# Patient Record
Sex: Female | Born: 2000 | Hispanic: Yes | Marital: Single | Smoking: Never smoker
Health system: Southern US, Community
[De-identification: ages and names within clinical notes are randomized; demographics above are authoritative.]

## PROBLEM LIST (undated history)

## (undated) DIAGNOSIS — Z789 Other specified health status: Secondary | ICD-10-CM

## (undated) HISTORY — PX: NO PAST SURGERIES: SHX2092

## (undated) HISTORY — DX: Other specified health status: Z78.9

---
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
## 2020-06-04 ENCOUNTER — Emergency Department: Payer: Medicaid Other

## 2020-06-04 ENCOUNTER — Emergency Department
Admission: EM | Admit: 2020-06-04 | Discharge: 2020-06-04 | Disposition: A | Discharge status: Home / Self Care | Payer: Medicaid Other | Attending: Emergency Medicine | Admitting: Emergency Medicine

## 2020-06-04 ENCOUNTER — Other Ambulatory Visit: Payer: Self-pay

## 2020-06-04 DIAGNOSIS — N939 Abnormal uterine and vaginal bleeding, unspecified: Secondary | ICD-10-CM | POA: Insufficient documentation

## 2020-06-04 DIAGNOSIS — E669 Obesity, unspecified: Secondary | ICD-10-CM | POA: Diagnosis not present

## 2020-06-04 LAB — POC URINE PREG, ED: Preg Test, Ur: NEGATIVE

## 2020-06-04 MED ORDER — NORGESTIMATE-ETH ESTRADIOL 0.25-35 MG-MCG PO TABS: 1.0000 | ORAL_TABLET | Freq: Every day | ORAL | 0 refills | Status: DC

## 2020-06-04 NOTE — ED Triage Notes (Signed)
C/O vaginal bleeding x 2 weeks.  Also passing clots at times.  AAOx3.  Skin warm and dry. NAD

## 2020-06-04 NOTE — ED Provider Notes (Signed)
[facility] Emergency Department Provider Note  ____________________________________________   Event Date/Time   First MD Initiated Contact with Patient 06/04/20 1638     (approximate)  I have reviewed the triage vital signs and the nursing notes.   HISTORY  Chief Complaint Vaginal Bleeding   HPI Michelle Hahn is a 20 y.o. female with a past medical history of irregular periods who presents for assessment of some vaginal spotting and bleeding that is abnormal for her.  Patient states that her last normal period was in the middle of April she started bleeding again towards end of April and had been bleeding almost daily since then initially with large clots but now just intermittently spotting.  She is not on any birth control.  She states she sometimes has some pelvic cramps but no other back pain, upper abdominal pain, abdominal discharge, burning with urination, chest pain, cough, shortness of breath, back pain, headache or earache, sore throat, fevers, chills, rash or any other acute sick symptoms.  Patient states she is not sure if she could have had a miscarriage and never took a pregnancy test but was not sure because she was passing clots.  No other acute concerns at this time.         History reviewed. No pertinent past medical history.  There are no problems to display for this patient.   History reviewed. No pertinent surgical history.  Prior to Admission medications   Medication Sig Start Date End Date Taking? Authorizing Provider  norgestimate-ethinyl estradiol (SPRINTEC 28) 0.25-35 MG-MCG tablet Take 1 tablet by mouth daily. 06/04/20 07/04/20 Yes Gilles Chiquito, MD  Ferrous Sulfate (IRON) 325 (65 Fe) MG TABS Take 1 tablet by mouth daily. 09/09/15   Nita Sickle, MD    Allergies Patient has no known allergies.  No family history on file.  Social History Social History   Tobacco Use  . Smoking status: Never Smoker  .  Smokeless tobacco: Never Used  Substance Use Topics  . Alcohol use: No    Review of Systems  Review of Systems  Constitutional: Negative for chills and fever.  HENT: Negative for sore throat.   Eyes: Negative for pain.  Respiratory: Negative for cough and stridor.   Cardiovascular: Negative for chest pain.  Gastrointestinal: Positive for abdominal pain. Negative for vomiting.  Genitourinary: Negative for dysuria.  Musculoskeletal: Negative for myalgias.  Skin: Negative for rash.  Neurological: Negative for seizures, loss of consciousness and headaches.  Psychiatric/Behavioral: Negative for suicidal ideas.  All other systems reviewed and are negative.     ____________________________________________   PHYSICAL EXAM:  VITAL SIGNS: ED Triage Vitals  Enc Vitals Group     BP 06/04/20 1616 125/72     Pulse Rate 06/04/20 1616 88     Resp 06/04/20 1616 19     Temp 06/04/20 1616 98.4 F (36.9 C)     Temp src --      SpO2 06/04/20 1616 100 %     Weight --      Height --      Head Circumference --      Peak Flow --      Pain Score 06/04/20 1613 0     Pain Loc --      Pain Edu? --      Excl. in GC? --    Vitals:   06/04/20 1616  BP: 125/72  Pulse: 88  Resp: 19  Temp: 98.4 F (36.9 C)  SpO2:  100%   Physical Exam Vitals and nursing note reviewed.  Constitutional:      General: She is not in acute distress.    Appearance: She is well-developed. She is obese.  HENT:     Head: Normocephalic and atraumatic.     Right Ear: External ear normal.     Left Ear: External ear normal.     Nose: Nose normal.     Mouth/Throat:     Mouth: Mucous membranes are moist.  Eyes:     Conjunctiva/sclera: Conjunctivae normal.  Cardiovascular:     Rate and Rhythm: Normal rate and regular rhythm.     Heart sounds: No murmur heard.   Pulmonary:     Effort: Pulmonary effort is normal. No respiratory distress.  Abdominal:     Palpations: Abdomen is soft.     Tenderness: There is  no abdominal tenderness. There is no right CVA tenderness or left CVA tenderness.  Musculoskeletal:     Cervical back: Neck supple.  Skin:    General: Skin is warm and dry.     Capillary Refill: Capillary refill takes less than 2 seconds.  Neurological:     Mental Status: She is alert and oriented to person, place, and time.  Psychiatric:        Mood and Affect: Mood normal.   No CVA tenderness.  Patient's abdomen is soft nontender throughout.  ____________________________________________   LABS (all labs ordered are listed, but only abnormal results are displayed)  Labs Reviewed  POC URINE PREG, ED   ____________________________________________  EKG ____________________________________________  RADIOLOGY  ED MD interpretation: Some thickening of the endometrial stripe without any focal abnormalities.  Otherwise unremarkable ultrasound and normal-appearing pelvis.  Official radiology report(s): US PELVIC COMPLETE WITH TRANSVAGINAL  Result Date: 06/04/2020 CLINICAL DATA:  Initial evaluation for painful vaginal bleeding for 2 weeks. EXAM: TRANSABDOMINAL AND TRANSVAGINAL ULTRASOUND OF PELVIS TECHNIQUE: Both transabdominal and transvaginal ultrasound examinations of the pelvis were performed. Transabdominal technique was performed for global imaging of the pelvis including uterus, ovaries, adnexal regions, and pelvic cul-de-sac. It was necessary to proceed with endovaginal exam following the transabdominal exam to visualize the uterus, endometrium, and ovaries. COMPARISON:  Prior CT from 02/20/2019. FINDINGS: Uterus Measurements: 6.5 x 3.0 x 4.0 cm = volume: 41.4 mL. Uterus is anteverted. No discrete fibroid or other mass. Endometrium Thickness: 13 mm.  No focal abnormality visualized. Right ovary Measurements: 2.6 x 2.2 x 2.0 cm = volume: 6.1 mL. Normal appearance/no adnexal mass. Left ovary Measurements: 2.7 x 2.0 x 2.8 cm = volume: 5.1 mL. Normal appearance/no adnexal mass. Other  findings No abnormal free fluid. IMPRESSION: 1. Endometrial stripe measures 13 mm in thickness without focal abnormality. If bleeding remains unresponsive to hormonal or medical therapy, sonohysterogram should be considered for focal lesion work-up. (Ref: Radiological Reasoning: Algorithmic Workup of Abnormal Vaginal Bleeding with Endovaginal Sonography and Sonohysterography. AJR 2008; 195:K93-26). 2. Otherwise unremarkable and normal pelvic ultrasound for age. No other acute abnormality. Electronically Signed   By: Rise Mu M.D.   On: 06/04/2020 18:41    ____________________________________________   PROCEDURES  Procedure(s) performed (including Critical Care):  Procedures   ____________________________________________   INITIAL IMPRESSION / ASSESSMENT AND PLAN / ED COURSE      Patient presents for assessment of some ongoing vaginal bleeding has been going on for several weeks that is abnormal for her.  She endorses intermittent lower abdominal pelvic cramping but no significant cramping emergency room.  On arrival she is afebrile hemodynamically  stable.  Her abdomen is soft nontender throughout and she has no CVA tenderness.  In addition she denies any anemia symptoms including shortness of breath, headache, fatigue, chest pain or lightheadedness.  Pregnancy test negative today.  She denies any abnormal discharges and fever overall Evalose patient for acute infectious process i.e. cystitis or PID.  Pelvic ultrasound remarkable for Some thickening of the endometrial stripe without any focal abnormalities.  Otherwise unremarkable ultrasound and normal-appearing pelvis.  No evidence of fibroid or other abnormal mass.  Suspect likely dysfunctional uterine bleeding.  Given stable vital signs with duration of symptoms with otherwise reassuring ultrasound and negative pregnancy test I think she is safe for discharge with continued outpatient oncology follow-up.  Will write for oral  contraceptive pills to see if this helps with her bleeding.  Discharged stable condition.  Strict return precautions advised discussed.  Advised to continue taking Tylenol ibuprofen as needed for some cramps.  ____________________________________________   FINAL CLINICAL IMPRESSION(S) / ED DIAGNOSES  Final diagnoses:  Abnormal uterine bleeding    Medications - No data to display   ED Discharge Orders         Ordered    norgestimate-ethinyl estradiol (SPRINTEC 28) 0.25-35 MG-MCG tablet  Daily        06/04/20 1901           Note:  This document was prepared using Dragon voice recognition software and may include unintentional dictation errors.   Gilles Chiquito, MD 06/04/20 1904

## 2020-06-04 NOTE — ED Triage Notes (Signed)
Pt comes with c/o possible miscarriage. Pt states she started bleeding couple weeks ago. Pt states large clots. Pt unsure if she was pregnant.

## 2020-06-13 ENCOUNTER — Encounter: Payer: Self-pay | Admitting: Obstetrics and Gynecology

## 2020-06-13 ENCOUNTER — Ambulatory Visit (INDEPENDENT_AMBULATORY_CARE_PROVIDER_SITE_OTHER): Payer: Medicaid Other | Admitting: Obstetrics and Gynecology

## 2020-06-13 ENCOUNTER — Other Ambulatory Visit: Payer: Self-pay

## 2020-06-13 ENCOUNTER — Other Ambulatory Visit (HOSPITAL_COMMUNITY)
Admission: RE | Admit: 2020-06-13 | Discharge: 2020-06-13 | Disposition: A | Discharge status: Home / Self Care | Payer: Medicaid Other | Source: Ambulatory Visit | Attending: Obstetrics and Gynecology | Admitting: Obstetrics and Gynecology

## 2020-06-13 VITALS — BP 120/80 | Ht 63.0 in | Wt 248.0 lb

## 2020-06-13 DIAGNOSIS — N926 Irregular menstruation, unspecified: Secondary | ICD-10-CM

## 2020-06-13 DIAGNOSIS — Z113 Encounter for screening for infections with a predominantly sexual mode of transmission: Secondary | ICD-10-CM | POA: Diagnosis not present

## 2020-06-13 NOTE — Progress Notes (Signed)
Patient ID: Michelle Hahn, female   DOB: 15-Aug-2000, 20 y.o.   MRN: [id number]  Reason for Consult: Vaginal Bleeding (Has been bleeding for 3 weeks, no abnormal pain) - f/u from ER visit   Subjective:     HPI:  Michelle Hahn is a 20 y.o. female who is following up after an ER visit for 3 weeks of vaginal bleeding. Patient reports a history of irregular periods since she was 20 years old. 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 During her ER visit she was started on OCPs for period regulation. Her vaginal bleeding has since resolved. She reports a desire for pregnancy at today's visit. She reports she has been trying to conceive since 01/2020.  Gynecological History Menarche: 9 Menopause: n/a LMP: 05/05/20 Describes periods as irregular - occasionally will last 1-2 weeks - 3 weeks was the longest she had experience her period Last pap smear: n/a Last Mammogram: n/a History of STDs: no Sexually Active: yes  Obstetrical History G1P0010  Past Medical History:  Diagnosis Date  . No pertinent past medical history    History reviewed. No pertinent family history. Past Surgical History:  Procedure Laterality Date  . NO PAST SURGERIES      Short Social History:  Social History   Tobacco Use  . Smoking status: Never Smoker  . Smokeless tobacco: Never Used  Substance Use Topics  . Alcohol use: Never    No Known Allergies  Current Outpatient Medications  Medication Sig Dispense Refill  . norgestimate-ethinyl estradiol (SPRINTEC 28) 0.25-35 MG-MCG tablet Take 1 tablet by mouth daily. 30 tablet 0   No current facility-administered medications for this visit.    Review of Systems  Constitutional:  Constitutional negative. HENT: HENT negative.  Eyes: Eyes negative.  Respiratory: Respiratory negative.  Cardiovascular: Cardiovascular negative.  GI: Gastrointestinal negative.  GU:       Vaginal  bleeding - now resolved Musculoskeletal: Musculoskeletal negative.  Skin: Skin negative.  Neurological: Neurological negative. Hematologic: Hematologic/lymphatic negative.  Psychiatric: Positive for depressed mood.       Objective:  Objective   Vitals:   06/13/20 1336  BP: 120/80  Weight: 248 lb (112.5 kg)  Height: 5\' 3"  (1.6 m)   Body mass index is 43.93 kg/m.  Physical Exam Constitutional:      General: She is not in acute distress.    Appearance: Normal appearance. She is obese. She is not ill-appearing.  HENT:     Head: Normocephalic.  Eyes:     Pupils: Pupils are equal, round, and reactive to light.  Cardiovascular:     Rate and Rhythm: Normal rate.  Pulmonary:     Effort: Pulmonary effort is normal.  Abdominal:     General: Abdomen is flat.     Palpations: Abdomen is soft.     Tenderness: There is no abdominal tenderness.  Genitourinary:    Comments: External: Normal appearing vulva. No lesions noted.  Speculum examination: Normal appearing cervix. No blood in the vaginal vault. Scant discharge.  Neurological:     Mental Status: She is alert.    Assessment/Plan:    20 yo, G1P0010, presents for evaluation of irregular menstrual cycle, concern for AUB-O  PCOS panel today for irregular menstrual cycles  Vaginal bleeding now controlled with OCPs. Plan to continue OCPs for cycle management for 2-3 months, follow-up for further fertility work-up as needed     Visit Diagnoses    Irregular menstruation    -  Primary   Relevant Orders   TSH+Prl+FSH+TestT+LH+DHEA S...   Screen for sexually transmitted diseases       Relevant Orders   Cervicovaginal ancillary only     Will follow-up with lab results  Zipporah Plants, CNM [facility], [facility] 06/13/2020 2:05 PM

## 2020-06-17 ENCOUNTER — Telehealth: Payer: Self-pay | Admitting: Obstetrics and Gynecology

## 2020-06-17 DIAGNOSIS — A749 Chlamydial infection, unspecified: Secondary | ICD-10-CM | POA: Insufficient documentation

## 2020-06-17 LAB — CERVICOVAGINAL ANCILLARY ONLY
Chlamydia: POSITIVE — AB
Comment: NEGATIVE
Comment: NORMAL
Neisseria Gonorrhea: NEGATIVE

## 2020-06-17 MED ORDER — DOXYCYCLINE HYCLATE 100 MG PO CAPS: 100.0000 mg | ORAL_CAPSULE | Freq: Two times a day (BID) | ORAL | 1 refills | Status: DC

## 2020-06-17 NOTE — Telephone Encounter (Signed)
Spoke with patient via phone. Utilized two patient identifiers. Reviewed positive Chlamydia result from 06/13/20 visit. Treatment rx'd. Reviewed need for partner treatment and discussed safe sex practices. Patient stated understanding. All questions answered. Message sent to scheduler to schedule for f/u in three months for retest.

## 2020-06-19 ENCOUNTER — Telehealth: Payer: Self-pay

## 2020-06-19 NOTE — Telephone Encounter (Signed)
-----   Message from Zipporah Plants, [place] sent at 06/17/2020  3:37 PM EDT ----- Regarding: Schedule for three month f/u for STI screening Needs to be scheduled for three month f/u for +chlamydia test. Thanks, Jae Dire

## 2020-06-19 NOTE — Telephone Encounter (Signed)
Patient is scheduled 09/22/20 at 2:10 with Helmut Muster Copland

## 2020-06-20 LAB — TSH+PRL+FSH+TESTT+LH+DHEA S...
17-Hydroxyprogesterone: 33 ng/dL
Androstenedione: 163 ng/dL (ref 41–262)
DHEA-SO4: 269 ug/dL (ref 110.0–433.2)
FSH: 5.6 m[IU]/mL
LH: 11.3 m[IU]/mL
Prolactin: 22.2 ng/mL (ref 4.8–23.3)
TSH: 1.8 u[IU]/mL (ref 0.450–4.500)
Testosterone, Free: 2.6 pg/mL
Testosterone: 50 ng/dL (ref 13–71)

## 2020-09-22 ENCOUNTER — Ambulatory Visit: Payer: Medicaid Other | Admitting: Obstetrics and Gynecology

## 2020-09-25 ENCOUNTER — Other Ambulatory Visit (HOSPITAL_COMMUNITY)
Admission: RE | Admit: 2020-09-25 | Discharge: 2020-09-25 | Disposition: A | Discharge status: Home / Self Care | Payer: Medicaid Other | Source: Ambulatory Visit | Attending: Obstetrics and Gynecology | Admitting: Obstetrics and Gynecology

## 2020-09-25 ENCOUNTER — Ambulatory Visit (INDEPENDENT_AMBULATORY_CARE_PROVIDER_SITE_OTHER): Payer: Medicaid Other | Admitting: Obstetrics and Gynecology

## 2020-09-25 ENCOUNTER — Other Ambulatory Visit: Payer: Self-pay

## 2020-09-25 ENCOUNTER — Encounter: Payer: Self-pay | Admitting: Obstetrics and Gynecology

## 2020-09-25 VITALS — BP 120/80 | Ht 63.0 in | Wt 252.0 lb

## 2020-09-25 DIAGNOSIS — N926 Irregular menstruation, unspecified: Secondary | ICD-10-CM

## 2020-09-25 DIAGNOSIS — A749 Chlamydial infection, unspecified: Secondary | ICD-10-CM

## 2020-09-25 DIAGNOSIS — Z113 Encounter for screening for infections with a predominantly sexual mode of transmission: Secondary | ICD-10-CM | POA: Diagnosis not present

## 2020-09-25 NOTE — Progress Notes (Signed)
Ardis Hughs, MD   Chief Complaint  Patient presents with   Follow-up    [facility]    HPI:      [name] is a 20 y.o. G1P0010 whose LMP was Patient's last menstrual period was 08/31/2020 (exact date)., presents today for chlamydia test of cure. Diagnosed 5/22 and treated with doxy. Husband did tx as well. No vag sx today.  Pt with hx of menses Q2-3 months, lasting 6-7 days, mod flow, no BTB, mild dysmen, improved with ibup. Menarche age 65 with monthly menses a couple yrs. Has been trying to conceive since 1/22. Taking PNVs. Has had positive urine ovulation pred kit. Was placed on OCPs 5/22 in ED due to 3 wks of vaginal bleeding with sx control, pt stopped them last month, waiting for cycle this month. Johnston Ebbs, CNM did PCOS labs 5/22 while pt on OCPs; were normal. Pt has been eval in past for hyperprolactinemia with endocrine. Normal levels on 5/22 labs.    Past Medical History:  Diagnosis Date   No pertinent past medical history     Past Surgical History:  Procedure Laterality Date   NO PAST SURGERIES      History reviewed. No pertinent family history.  Social History   Socioeconomic History   Marital status: Single    Spouse name: Not on file   Number of children: Not on file   Years of education: Not on file   Highest education level: Not on file  Occupational History   Not on file  Tobacco Use   Smoking status: Never   Smokeless tobacco: Never  Vaping Use   Vaping Use: Never used  Substance and Sexual Activity   Alcohol use: Never   Drug use: Never   Sexual activity: Yes    Birth control/protection: None  Other Topics Concern   Not on file  Social History Narrative   Not on file   Social Determinants of Health   Financial Resource Strain: Not on file  Food Insecurity: Not on file  Transportation Needs: Not on file  Physical Activity: Not on file  Stress: Not on file  Social Connections: Not on file  Intimate Partner Violence: Not on  file    Outpatient Medications Prior to Visit  Medication Sig Dispense Refill   doxycycline (VIBRAMYCIN) 100 MG capsule Take 1 capsule (100 mg total) by mouth 2 (two) times daily. 14 capsule 1   norgestimate-ethinyl estradiol (SPRINTEC 28) 0.25-35 MG-MCG tablet Take 1 tablet by mouth daily. 30 tablet 0   No facility-administered medications prior to visit.      ROS:  Review of Systems  Constitutional:  Negative for fever.  Gastrointestinal:  Negative for blood in stool, constipation, diarrhea, nausea and vomiting.  Genitourinary:  Positive for menstrual problem. Negative for dyspareunia, dysuria, flank pain, frequency, hematuria, urgency, vaginal bleeding, vaginal discharge and vaginal pain.  Musculoskeletal:  Negative for back pain.  Skin:  Negative for rash.  BREAST: No symptoms   OBJECTIVE:   Vitals:  BP 120/80   Ht _0  (1.6 m)   Wt 252 lb (114.3 kg)   LMP 08/31/2020 (Exact Date)   BMI 44.64 kg/m   Physical Exam Vitals reviewed.  Constitutional:      Appearance: She is well-developed.  Pulmonary:     Effort: Pulmonary effort is normal.  Genitourinary:    General: Normal vulva.     Pubic Area: No rash.      Labia:  Right: No rash, tenderness or lesion.        Left: No rash, tenderness or lesion.      Vagina: Normal. No vaginal discharge, erythema or tenderness.     Cervix: Normal.     Uterus: Normal. Not enlarged and not tender.      Adnexa: Right adnexa normal and left adnexa normal.       Right: No mass or tenderness.         Left: No mass or tenderness.    Musculoskeletal:        General: Normal range of motion.     Cervical back: Normal range of motion.  Skin:    General: Skin is warm and dry.  Neurological:     General: No focal deficit present.     Mental Status: She is alert and oriented to person, place, and time.  Psychiatric:        Mood and Affect: Mood normal.        Behavior: Behavior normal.        Thought Content: Thought content  normal.        Judgment: Judgment normal.    Assessment/Plan: Chlamydia - Plan: Cervicovaginal ancillary only; test of cure today, will f/u with results.   Screening for STD (sexually transmitted disease) - Plan: Cervicovaginal ancillary only  Irregular menses--neg labs, could be normal cycle for her. Pos urine ovulation pred kits per pt. Discussed pt needs to try a year to conceive and even though started 1/22, was also on OCPs for 3 months this summer. Pt's chances of conception less per yr due to Q2-3 months menses, but pt needs to give it more time. Cont PNVs, f/u with MD for fertility tx next yr if needed.      Return if symptoms worsen or fail to improve.  Brooklyne Radke B. Isahi Godwin, PA-C 09/25/2020 10:19 AM

## 2020-09-30 LAB — CERVICOVAGINAL ANCILLARY ONLY
Chlamydia: NEGATIVE
Comment: NEGATIVE
Comment: NEGATIVE
Comment: NORMAL
Neisseria Gonorrhea: NEGATIVE
Trichomonas: NEGATIVE

## 2021-05-13 NOTE — Telephone Encounter (Signed)
Just reaching back out because we had to change your appt to 10:55am on May 17. Because the provider want be here on 05-22-21. If the May 17@ 10:55 don't work for you call the office [phone]. I've tried to call you and your phone is disconnected. ?Thanks have a great day ?

## 2021-05-22 ENCOUNTER — Ambulatory Visit: Payer: Medicaid Other | Admitting: Obstetrics and Gynecology

## 2021-06-10 ENCOUNTER — Ambulatory Visit: Payer: Medicaid Other | Admitting: Obstetrics and Gynecology

## 2021-06-25 ENCOUNTER — Ambulatory Visit (INDEPENDENT_AMBULATORY_CARE_PROVIDER_SITE_OTHER): Payer: Medicaid Other | Admitting: Obstetrics and Gynecology

## 2021-06-25 ENCOUNTER — Encounter: Payer: Self-pay | Admitting: Obstetrics and Gynecology

## 2021-06-25 VITALS — BP 120/80 | Ht 63.0 in | Wt 255.0 lb

## 2021-06-25 DIAGNOSIS — E282 Polycystic ovarian syndrome: Secondary | ICD-10-CM

## 2021-06-25 DIAGNOSIS — Z6841 Body Mass Index (BMI) 40.0 and over, adult: Secondary | ICD-10-CM | POA: Diagnosis not present

## 2021-06-25 MED ORDER — VITAFOL ULTRA 29-0.6-0.4-200 MG PO CAPS: 1.0000 | ORAL_CAPSULE | Freq: Every day | ORAL | 12 refills | Status: DC

## 2021-06-25 NOTE — Progress Notes (Signed)
21 yo P0 with BMI 45 presenting today for discussion on PCOS. Patient reports menarche at 21 years old. She reports onset of irregular cycles at the age of 21 when she started skipping 2-3 months. She states her periods typically last 6-7days and are heavy in flow the first 2-3 days and are associated with dysmenorrhea. Patient took [place] for 3 months and states her cycles were heavier. She has tried the Nexplanon in the past. Patient desires to conceive and is not interested in cycle control with hormonal contraception. Patient is without pelvic pain or abnormal discharge. Patient denies hirsutism.   Past Medical History:  Diagnosis Date   No pertinent past medical history    Past Surgical History:  Procedure Laterality Date   NO PAST SURGERIES     Family History  Problem Relation Age of Onset   Cancer Paternal Grandfather    Social History   Tobacco Use   Smoking status: Never   Smokeless tobacco: Never  Vaping Use   Vaping Use: Never used  Substance Use Topics   Alcohol use: Never   Drug use: Never   ROS See pertinent in HPI. All other systems reviewed and non contributory  Blood pressure 120/80, height 5\' 3"  (1.6 m), weight 255 lb (115.7 kg), last menstrual period 06/01/2021. GENERAL: Well-developed, well-nourished female in no acute distress. Obese NEURO: alert and oriented x3  A/P 21 yo with likely PCOS - Rx prenatal vitamins provided - Patient advised to use ovulation predictor kits to help with timing of intercourse for conception - Discussed benefits of weight loss with fertility and restoration of a monthly cycle- patient referred to nutritionist - RTC in a year or prn

## 2021-08-18 ENCOUNTER — Ambulatory Visit (INDEPENDENT_AMBULATORY_CARE_PROVIDER_SITE_OTHER): Payer: Medicaid Other | Admitting: Family Medicine

## 2021-08-18 ENCOUNTER — Encounter: Payer: Self-pay | Admitting: Family Medicine

## 2021-08-18 VITALS — BP 122/74 | Ht 63.0 in | Wt 256.0 lb

## 2021-08-18 DIAGNOSIS — N926 Irregular menstruation, unspecified: Secondary | ICD-10-CM | POA: Diagnosis not present

## 2021-08-18 DIAGNOSIS — E282 Polycystic ovarian syndrome: Secondary | ICD-10-CM | POA: Diagnosis not present

## 2021-08-18 LAB — POCT URINE PREGNANCY: Preg Test, Ur: NEGATIVE

## 2021-08-18 MED ORDER — NORGESTIMATE-ETH ESTRADIOL 0.25-35 MG-MCG PO TABS: 1.0000 | ORAL_TABLET | Freq: Every day | ORAL | 4 refills | Status: DC

## 2021-08-18 NOTE — Progress Notes (Signed)
   GYNECOLOGY PROBLEM  VISIT ENCOUNTER NOTE  Subjective:   Michelle Hahn is a 21 y.o. G7P0010 female here for a problem GYN visit.  Current complaints:  Chief Complaint  Patient presents with   Follow-up   Patient with known history of irregular cycles and has been seen multiple times of this. Last visit was in June 2023 and provider reviewed cycle control with COC but patient desired to conceive at that time. She has been bleeding for a month now per her report.   Patient reports a 4-5 day cycle and then had bleeding for 2 weeks. Reports she called her PCP and they gave her Sprintec for 1 month. She reports she took it for a week and the bleeding improved but then she stopped it for 4 days and the bleeding was heavy. She then reports missing several days of pills.   She tends to take the pills until the bleeding lessens and then stops them for 3-4 days.   Denies abnormal vaginal bleeding, discharge, pelvic pain, problems with intercourse or other gynecologic concerns.    Gynecologic History Patient's last menstrual period was 07/21/2021.  Contraception: none  Health Maintenance Due  Topic Date Due   COVID-19 Vaccine (1) Never done   HPV VACCINES (1 - 2-dose series) Never done   HIV Screening  Never done   Hepatitis C Screening  Never done   TETANUS/TDAP  Never done    The following portions of the patient's history were reviewed and updated as appropriate: allergies, current medications, past family history, past medical history, past social history, past surgical history and problem list.  Review of Systems Pertinent items are noted in HPI.   Objective:  BP 122/74   Ht 5\' 3"  (1.6 m)   Wt 256 lb (116.1 kg)   LMP 07/21/2021   BMI 45.35 kg/m  Gen: well appearing, NAD HEENT: no scleral icterus CV: RR Lung: Normal WOB Ext: warm well perfused     Assessment and Plan:   1. Menstrual cycle problem - POCT urine pregnancy  2. PCOS (polycystic ovarian  syndrome) UP negative Discussed that for COC to control her bleeding she needs to take daily and that bleeding regulation is usually seen after 1-3 months of consistent use Patient desires a pregnancy but also has periods every 2-5 months when not on OCP. Discussed with client being on COC for at least 6 months to see if this improves her ovarian function  Recommended 10% weight loss to improve her cycle and her fertility - norgestimate-ethinyl estradiol (ORTHO-CYCLEN) 0.25-35 MG-MCG tablet; Take 1 tablet by mouth daily.  Dispense: 84 tablet; Refill: 4  Please refer to After Visit Summary for other counseling recommendations.   No follow-ups on file.  10-11-1998, MD, MPH, ABFM Attending Physician Faculty Practice- [facility]

## 2021-09-07 ENCOUNTER — Encounter: Payer: Medicaid Other | Attending: Pediatrics | Admitting: Registered"

## 2021-09-07 ENCOUNTER — Encounter: Payer: Self-pay | Admitting: Registered"

## 2021-09-07 DIAGNOSIS — E282 Polycystic ovarian syndrome: Secondary | ICD-10-CM

## 2021-09-07 NOTE — Progress Notes (Signed)
Medical Nutrition Therapy  Appointment Start time:  [phone]  Appointment End time:  1035  Primary concerns today: Pt states she wants to lose weight but is having trouble d/t PCOS.  Referral diagnosis: PCOS Preferred learning style: no preference indicated Learning readiness: ready  NUTRITION ASSESSMENT  Anthropometrics  Not assessed.   Clinical Medical Hx: PCOS, vitamin D deficiency Medications: estradiol, melatonin 2x/wk, prenatal vitamin Labs: 06/07/2016: Insulin- 25, A1C 5.3%. 06/06/2015: Vitamin D 23.9. Notable Signs/Symptoms: hair loss, irregular periods, occasional low energy.   Lifestyle & Dietary Hx Diagnosed with PCOS in 2016. Recently started treating it with birth control, but had not treated it before. Has gone through cycles of trying to lose weight in the past but states she gains it back. Family members highly involved in her weight which increases stress.   Pt reports 5 years ago she stopped eating breakfast trying to lose weight and now states if she eats before lunch will feel sick. In the afternoon when her stomach starts growling she will try to drink water or sleep it off to avoid eating.   Pt reports in the past pt has gone through periods of 'starving herself' and states she would occasionally make herself sick.  Pt reports she and her husband are living with brother-in-law. Pt states fridge broken x1/ month, has to eat out more. Pt states they are looking into getting a small fridge.   Estimated daily fluid intake: > 64 oz Supplements: Prenatal vitamin Sleep: 8 hours. Mostly tossing and turning.  Nap: 2x/wk 20-30 minutes Stress / self-care: high stress. 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 Stress from work.  Current average weekly physical activity: walking around house or neighborhood.  Bowel Movement: Daily  24-Hr Dietary Recall First Meal: skips (hasn't eaten breakfast since 21yo d/t family pressure to lose weight) Snack: none Second Meal: 1-2pm feels hungry  (sick/tired).  Snack: 4pm: [place] (street corn: corn cob with mayo, cheese, and tajin.  Third Meal: 9pm: [place] buffet: broccoli, chicken (2oz), mushrooms, noodles, shrimp (10), oysters (2), sushi (4 pieces).  Snack: watermelon Beverages: water (4 bottles), juice (gatorade, powerade, Snapple), occasional soda, 'Bloom' superfood powder.   NUTRITION DIAGNOSIS  NB-1.1 Food and nutrition-related knowledge deficit As related to protein at breakfast to help insulin.  As evidenced by pt states she does not eat breakfast.  NUTRITION INTERVENTION  Nutrition education (E-1) on the following topics:  Insulin Resistance PCOS pathophysiology  MyPlate Macronutrients Sleep  Handouts Provided Include  Polycystic Ovarian Syndrome (PCOS): A Guide for Parents Sleep Hygiene  Learning Style & Readiness for Change Teaching method utilized: Visual & Auditory  Demonstrated degree of understanding via: Teach Back  Barriers to learning/adherence to lifestyle change: no working refrigerator  Goals Established by Pt Aim to include protein in breakfast. Since you are not used to eating breakfast start slow. Try 1 egg on whole wheat toast. Consider Vitamin D supplementation (1000-2000 IU daily). Practice mindfulness when eating. Challenge the voices in your head and trust your body.  Create a nighttime routine to encourage a good night's rest. Wind down before bed and go to sleep when you are sleepy.   MONITORING & EVALUATION Dietary intake, weekly physical activity, and follow up in 2 months.

## 2021-09-07 NOTE — Patient Instructions (Addendum)
Aim to include protein in breakfast. Since you are not used to eating breakfast start slow. Try 1 egg on whole wheat toast. Consider Vitamin D supplementation (1000-2000 IU daily). Practice mindfulness when eating. Challenge the voices in your head and trust your body.  Create a nighttime routine to encourage a good night's rest. Wind down before bed and go to sleep when you are sleepy.

## 2021-09-14 ENCOUNTER — Ambulatory Visit: Payer: Medicaid Other | Admitting: Registered"

## 2021-10-15 IMAGING — US US PELVIS COMPLETE WITH TRANSVAGINAL
1 series · 15 of 25 positions shown · non-contrast
Comparison: Prior CT from 02/20/2019.

CLINICAL DATA: Initial evaluation for painful vaginal bleeding for
2 weeks.



[Series 1: us pelvis complete · 15 of 128 slices shown]
[im 1/128]
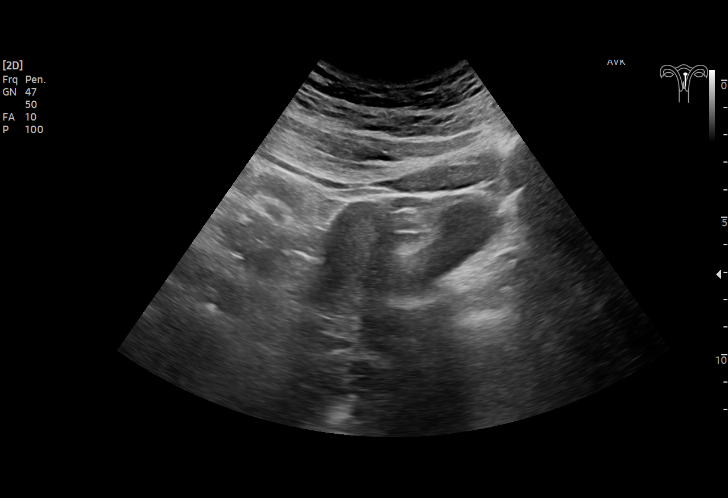
[im 11/128]
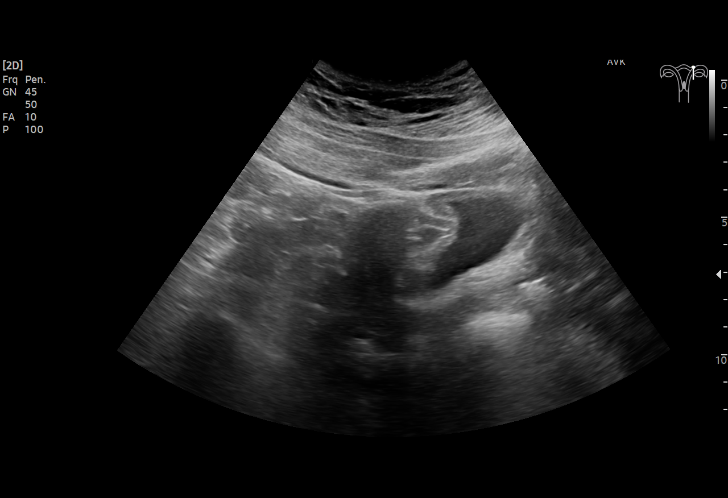
[im 22/128]
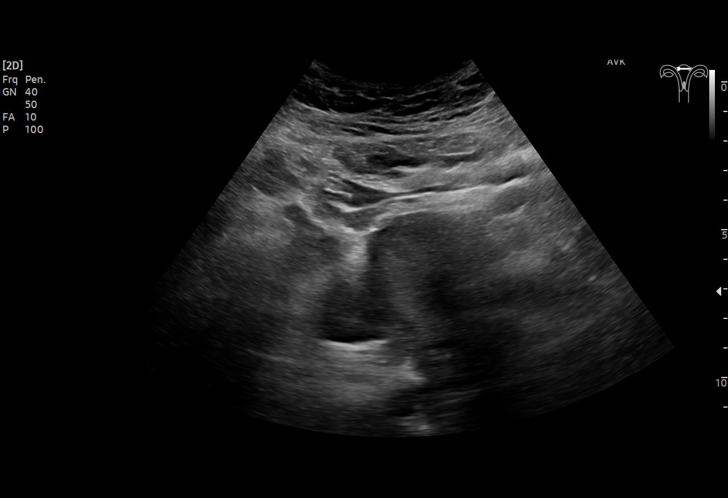
[im 27/128]
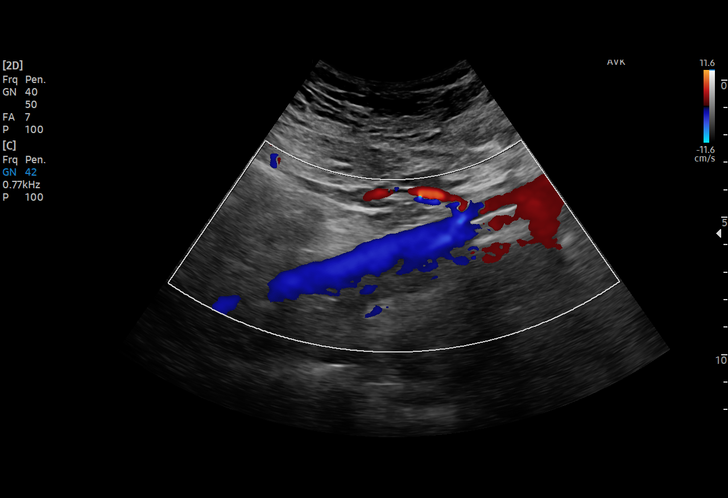
[im 38/128]
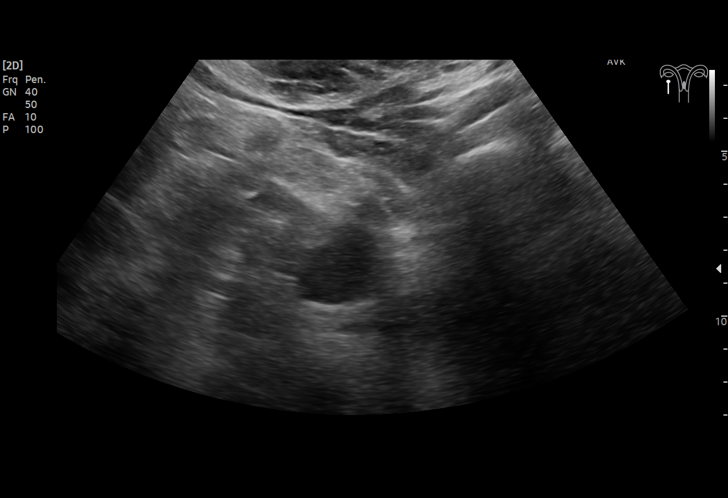
[im 48/128]
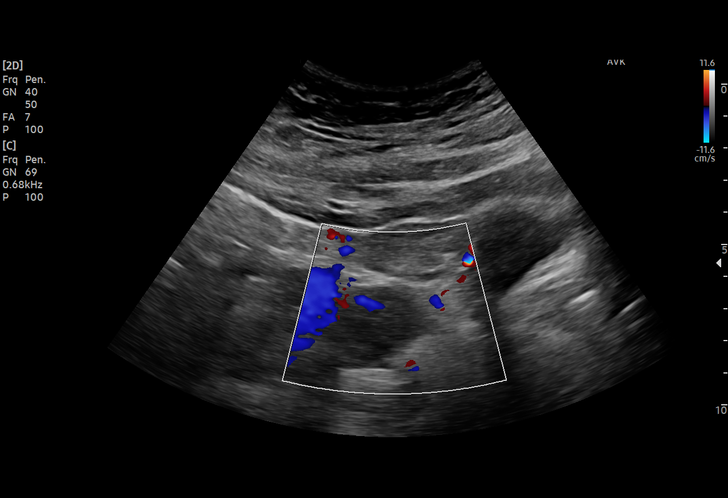
[im 53/128]
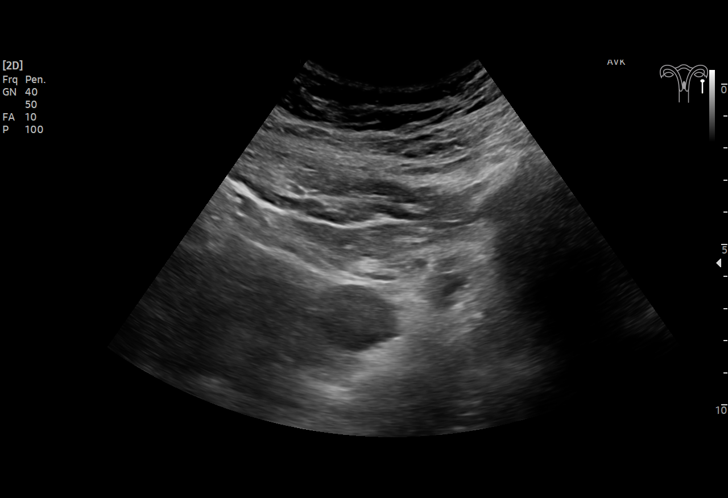
[im 64/128]
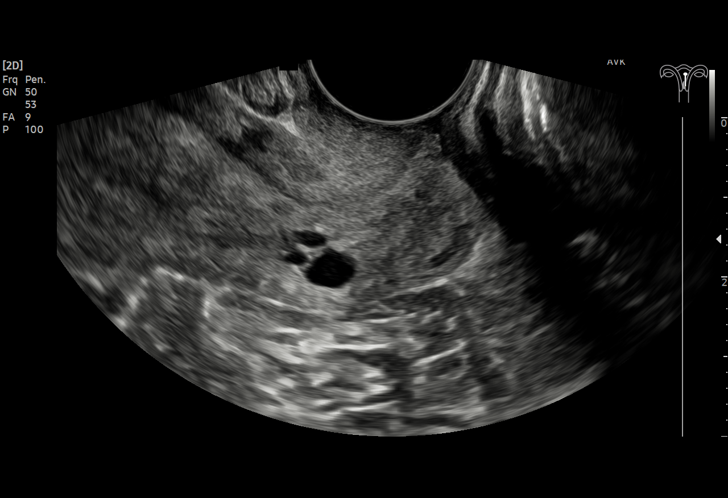
[im 75/128]
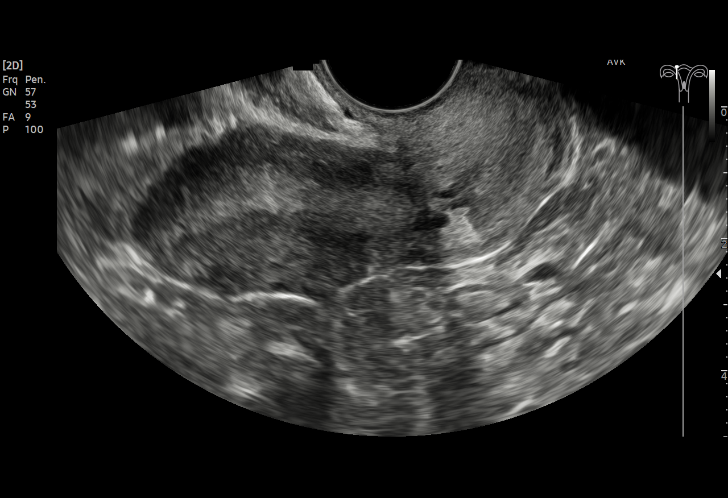
[im 80/128]
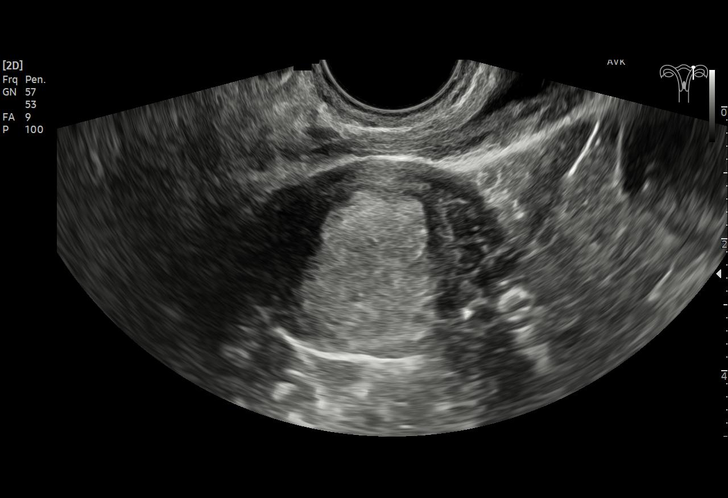
[im 90/128]
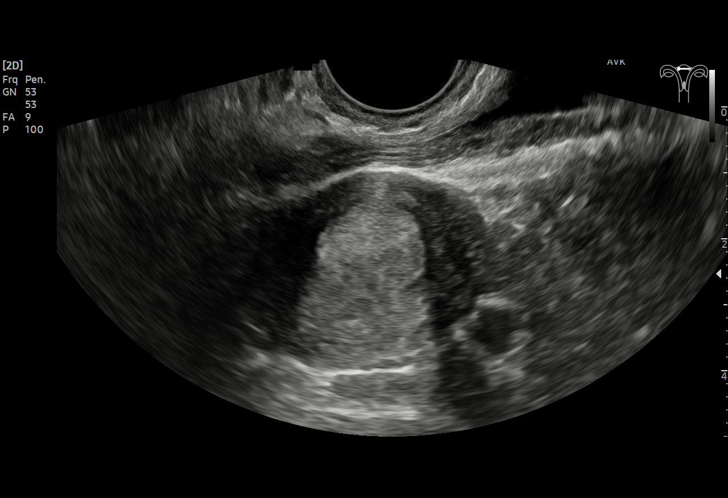
[im 101/128]
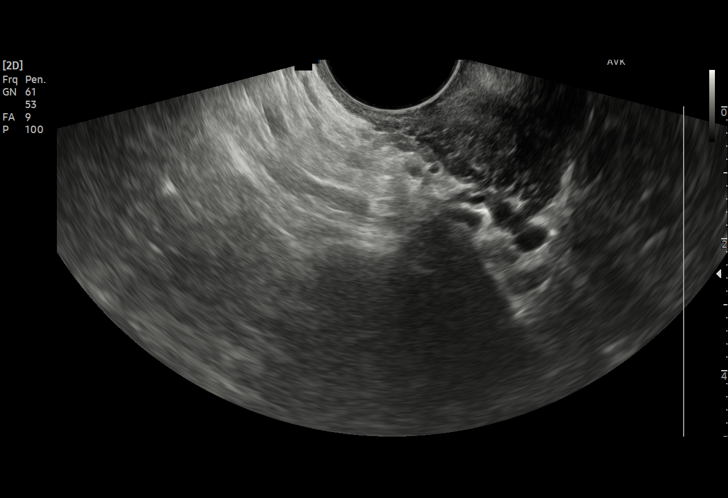
[im 106/128]
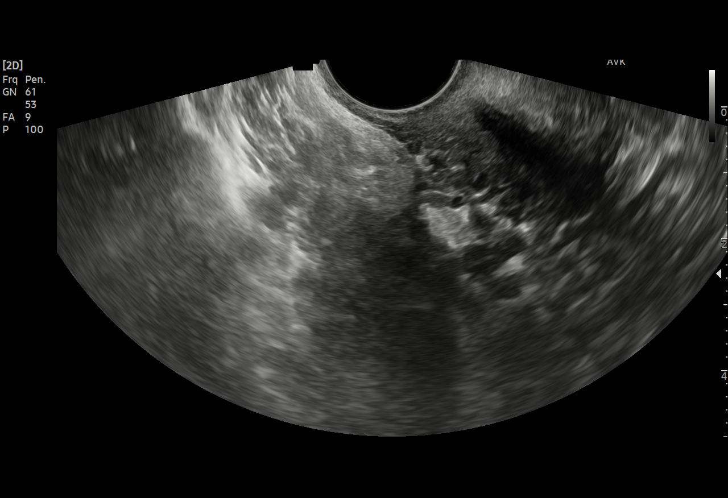
[im 117/128]
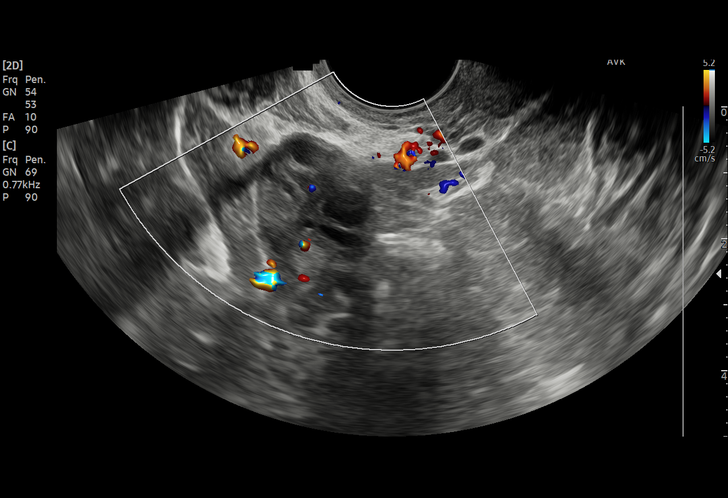
[im 128/128]
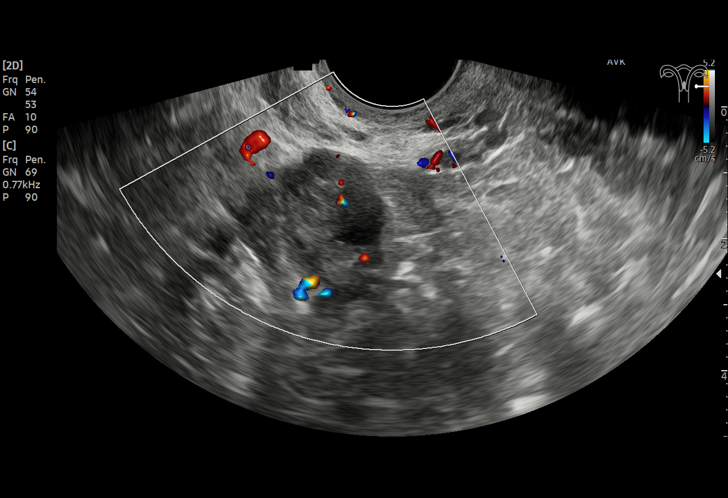

[15 of 25 positions shown; findings below may reference images not displayed]

FINDINGS: Uterus

Measurements: 6.5 x 3.0 x 4.0 cm = volume: 41.4 mL. Uterus is
anteverted. No discrete fibroid or other mass.

Endometrium

Thickness: 13 mm.  No focal abnormality visualized.

Right ovary

Measurements: 2.6 x 2.2 x 2.0 cm = volume: 6.1 mL. Normal
appearance/no adnexal mass.

Left ovary

Measurements: 2.7 x 2.0 x 2.8 cm = volume: 5.1 mL. Normal
appearance/no adnexal mass.

Other findings

No abnormal free fluid.
IMPRESSION: 1. Endometrial stripe measures 13 mm in thickness without focal
abnormality. If bleeding remains unresponsive to hormonal or medical
therapy, sonohysterogram should be considered for focal lesion
work-up. (Ref: Radiological Reasoning: Algorithmic Workup of
Abnormal Vaginal Bleeding with Endovaginal Sonography and
Sonohysterography. AJR [id number]; 191:[id number]).
2. Otherwise unremarkable and normal pelvic ultrasound for age. No
other acute abnormality.

## 2021-11-09 ENCOUNTER — Encounter: Payer: Medicaid Other | Attending: Obstetrics and Gynecology | Admitting: Registered"

## 2021-11-09 ENCOUNTER — Encounter: Payer: Self-pay | Admitting: Registered"

## 2021-11-09 DIAGNOSIS — E282 Polycystic ovarian syndrome: Secondary | ICD-10-CM | POA: Insufficient documentation

## 2021-11-09 NOTE — Progress Notes (Signed)
Medical Nutrition Therapy  Appointment Start time:  [phone]  Appointment End time:  1010  Primary concerns today: Pt states she wants to improve health before trying to conceive Referral diagnosis: PCOS Preferred learning style: no preference indicated Learning readiness: ready  NUTRITION ASSESSMENT  Anthropometrics  Not assessed.   Clinical Medical Hx: PCOS, vitamin D deficiency Medications: estradiol (pt states stopped), melatonin 2x/wk, prenatal vitamin Labs: 06/07/2016: Insulin- 25, A1C 5.3%. 06/06/2015: Vitamin D 23.9. Notable Signs/Symptoms: hair loss, irregular periods, occasional low energy.   Lifestyle & Dietary Hx Pt states stopped using birth control because she was bleeding continually with heavy clotting. Pt states since she has stopped she had a normal period with some cramping, but not too bad.  Pt states she heard about Ovasitol and is considering trying that supplement.  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 Pt state she isn't working with husband any more, not at [employer] around and taping up boxes.  Diet: pt states where she works they have a cafeteria and provide breakfast which might be a chicken biscuit or banana nut muffin with sugar-free energy drink or she may just have a protein bar and juice. Pt states sometimes they provide lunch, but it is usually pizza. Pt has a place to store a packed lunch, but there is not a refrigerator or microwave.  Pt states usually eats out on the weekends. Usually cooks dinner at home, but eats breakfast and lunch at work.  Estimated daily fluid intake: > 64 oz Supplements: Prenatal vitamin Sleep: 8 hours. Not assessed this visit.  Nap: 2x/wk 20-30 minutes not assessed this visit Stress / self-care: high stress. 6/10 (was 8/10) Current average weekly physical activity: ADL (active at work)  24-Hr Dietary  Recall (Was a weekend which different than weekdays) First Meal: pancakes a little bit of syrup and eggs, hot tea with honey and lemon at a restaurant,  Snack: none Second Meal: slept through lunch  Snack: none  Third Meal: 7:30 pm: fish soup, a little of starry soda and water Snack: none Beverages: water (8 bottles), juice (sugar-free gatorade, powerade, Snapple), occasional apple juice, 'Bloom' superfood powder.   NUTRITION DIAGNOSIS  NB-1.1 Food and nutrition-related knowledge deficit As related to protein at breakfast to help insulin.  As evidenced by pt states she does not eat breakfast.  NUTRITION INTERVENTION  Nutrition education (E-1) on the following topics:  Insulin and glucose Inositol / metformin to treat PCOS A1c test and why may be helpful before becoming pregnant  Handouts Provided Include  What is inositol A1c chart  Learning Style & Readiness for Change Teaching method utilized: Visual & Auditory  Demonstrated degree of understanding via: Teach Back  Barriers to learning/adherence to lifestyle change: no working refrigerator  Goals Established by Pt Include protein with breakfast: If having muffin, consider having nuts or hard boiled Lunch: To avoid having to eat fast food for lunch on work days, Consider buying a lunch bag that will keep your food cold until lunchtime. Sandwich, fruit, vegetables OR Salad, chips and fruit. Dinner: rice, beans, broccoli, meat  Continue with improved sleep habits.   At your Nov 6 appt with Dr. Patsy Lager consider asking to have an updated A1c test done. This would be good information before you decide to get pregnant. You can also ask about metformin since you did not do well with  birth control  If you decide to try Ovasitol, you can use the [facility] co [id number].   MONITORING & EVALUATION Dietary intake, weekly physical activity, and follow up prn.

## 2021-11-09 NOTE — Patient Instructions (Addendum)
Include protein with breakfast: If having muffin, consider having nuts or hard boiled Lunch: To avoid having to eat fast food for lunch on work days, Consider buying a lunch bag that will keep your food cold until lunchtime. Sandwich, fruit, vegetables OR Salad, chips and fruit. Dinner: rice, beans, broccoli, meat  Continue with improved sleep habits.   At your Nov 6 appt with Dr. Lorelei Pont consider asking to have an updated A1c test done. This would be good information before you decide to get pregnant. You can also ask about metformin since you did not do well with birth control  If you decide to try Ovasitol, you can use the [facility] co 227401.

## 2021-11-19 ENCOUNTER — Ambulatory Visit: Payer: Self-pay | Admitting: Obstetrics and Gynecology

## 2021-11-29 NOTE — Progress Notes (Addendum)
Dorann Lodge, MD   Chief Complaint  Patient presents with   Follow-up    OCP's did not help. Pt feels depresses/irritated when taking BC. Pt continued to have the irregular vaginal bleeding so she stopped the birth control.    HPI:      [name] is a 21 y.o. G1P0010 whose LMP was Patient's last menstrual period was 06/25/2021 (approximate)., presents today for PCOS/OCP f/u. Started on OCPs 7/23 with Dr. Alvester Morin due to irregular bleeding. Pt with mood changes/depression, increased bleeding with heavier flow and dysmen, so stopped pills and sx resolved. Hx of PCOS with Q2-3 month menses, lasting 6-7 days, mod flow, small clots, no BTB, mild dysmen. Pt would like to conceive. Has been trying with husband for over a yr. Has had positive urine ovulation pred kit results but husband out of town at that time. He's not traveling as much now. Pt is taking PNVs. Seeing nutritionist, interested in getting HgA1C test and starting metformin for PCOS/conception. Is exercising and eating less to lose wt. Had gained wt with OCPs. Pt has never had pap smear, due today.  Patient Active Problem List   Diagnosis Date Noted   PCOS (polycystic ovarian syndrome) 11/09/2021   Irregular menses 09/25/2020   Chlamydia 06/17/2020    Past Surgical History:  Procedure Laterality Date   NO PAST SURGERIES      Family History  Problem Relation Age of Onset   Stomach cancer Paternal Grandfather     Social History   Socioeconomic History   Marital status: Single    Spouse name: Not on file   Number of children: Not on file   Years of education: Not on file   Highest education level: Not on file  Occupational History   Not on file  Tobacco Use   Smoking status: Never   Smokeless tobacco: Never  Vaping Use   Vaping Use: Never used  Substance and Sexual Activity   Alcohol use: Never   Drug use: Never   Sexual activity: Yes    Birth control/protection: None  Other Topics Concern    Not on file  Social History Narrative   Not on file   Social Determinants of Health   Financial Resource Strain: Not on file  Food Insecurity: No Food Insecurity (09/07/2021)   Hunger Vital Sign    Worried About Running Out of Food in the Last Year: Never true    Ran Out of Food in the Last Year: Never true  Transportation Needs: Not on file  Physical Activity: Not on file  Stress: Not on file  Social Connections: Not on file  Intimate Partner Violence: Not on file    Outpatient Medications Prior to Visit  Medication Sig Dispense Refill   Prenatal Vit-Fe Fumarate-FA (PRENATAL MULTIVITAMIN) TABS tablet Take 1 tablet by mouth daily at 12 noon.     norgestimate-ethinyl estradiol (ORTHO-CYCLEN) 0.25-35 MG-MCG tablet Take 1 tablet by mouth daily. (Patient not taking: Reported on 11/30/2021) 84 tablet 4   No facility-administered medications prior to visit.      ROS:  Review of Systems  Constitutional:  Negative for fever.  Gastrointestinal:  Negative for blood in stool, constipation, diarrhea, nausea and vomiting.  Genitourinary:  Positive for menstrual problem. Negative for dyspareunia, dysuria, flank pain, frequency, hematuria, urgency, vaginal bleeding, vaginal discharge and vaginal pain.  Musculoskeletal:  Negative for back pain.  Skin:  Negative for rash.   BREAST: No symptoms   OBJECTIVE:  Vitals:  BP 108/70   Ht 5\' 3"  (1.6 m)   Wt 260 lb (117.9 kg)   LMP 06/25/2021 (Approximate)   BMI 46.06 kg/m   Physical Exam Vitals reviewed.  Constitutional:      Appearance: She is well-developed.  Pulmonary:     Effort: Pulmonary effort is normal.  Genitourinary:    General: Normal vulva.     Pubic Area: No rash.      Labia:        Right: No rash, tenderness or lesion.        Left: No rash, tenderness or lesion.      Vagina: Normal. No vaginal discharge, erythema or tenderness.     Cervix: Normal.     Uterus: Normal. Not enlarged and not tender.      Adnexa: Right  adnexa normal and left adnexa normal.       Right: No mass or tenderness.         Left: No mass or tenderness.    Musculoskeletal:        General: Normal range of motion.     Cervical back: Normal range of motion.  Skin:    General: Skin is warm and dry.  Neurological:     General: No focal deficit present.     Mental Status: She is alert and oriented to person, place, and time.  Psychiatric:        Mood and Affect: Mood normal.        Behavior: Behavior normal.        Thought Content: Thought content normal.        Judgment: Judgment normal.     Assessment/Plan: PCOS (polycystic ovarian syndrome) - Plan: Hemoglobin A1c, metFORMIN (GLUCOPHAGE) 500 MG tablet; check HgA1C, start metformin. Rx eRxd. RTO in 3 months for f/u. Discussed low carb diet for insulin sensitivity/fertility/PCOS.   Irregular menses - Plan: metFORMIN (GLUCOPHAGE) 500 MG tablet  Infertility counseling  Cervical cancer screening - Plan: Cytology - PAP  Screening for STD (sexually transmitted disease) - Plan: Cytology - PAP    Meds ordered this encounter  Medications   metFORMIN (GLUCOPHAGE) 500 MG tablet    Sig: Take 1 tablet (500 mg total) by mouth daily with breakfast.    Dispense:  90 tablet    Refill:  0    Order Specific Question:   Supervising Provider    Answer:   Hildred Laser [[id number]]      Return in about 3 months (around 03/02/2022) for PCOS f/u.  Josafat Enrico B. Cierah Crader, PA-C 11/30/2021 4:29 PM

## 2021-11-30 ENCOUNTER — Other Ambulatory Visit (HOSPITAL_COMMUNITY)
Admission: RE | Admit: 2021-11-30 | Discharge: 2021-11-30 | Disposition: A | Discharge status: Home / Self Care | Payer: Medicaid Other | Source: Ambulatory Visit | Attending: Obstetrics and Gynecology | Admitting: Obstetrics and Gynecology

## 2021-11-30 ENCOUNTER — Ambulatory Visit: Payer: Medicaid Other | Admitting: Obstetrics and Gynecology

## 2021-11-30 ENCOUNTER — Encounter: Payer: Self-pay | Admitting: Obstetrics and Gynecology

## 2021-11-30 VITALS — BP 108/70 | Ht 63.0 in | Wt 260.0 lb

## 2021-11-30 DIAGNOSIS — Z113 Encounter for screening for infections with a predominantly sexual mode of transmission: Secondary | ICD-10-CM | POA: Diagnosis present

## 2021-11-30 DIAGNOSIS — N926 Irregular menstruation, unspecified: Secondary | ICD-10-CM

## 2021-11-30 DIAGNOSIS — Z124 Encounter for screening for malignant neoplasm of cervix: Secondary | ICD-10-CM | POA: Insufficient documentation

## 2021-11-30 DIAGNOSIS — Z3169 Encounter for other general counseling and advice on procreation: Secondary | ICD-10-CM

## 2021-11-30 DIAGNOSIS — E282 Polycystic ovarian syndrome: Secondary | ICD-10-CM | POA: Diagnosis not present

## 2021-11-30 MED ORDER — METFORMIN HCL 500 MG PO TABS: 500.0000 mg | ORAL_TABLET | Freq: Every day | ORAL | 0 refills | Status: AC

## 2021-11-30 NOTE — Patient Instructions (Signed)
I value your feedback and you entrusting us with your care. If you get a [place] patient survey, I would appreciate you taking the time to let us know about your experience today. Thank you! ? ? ?

## 2021-12-01 LAB — HEMOGLOBIN A1C
Est. average glucose Bld gHb Est-mCnc: 131 mg/dL
Hgb A1c MFr Bld: 6.2 % — ABNORMAL HIGH (ref 4.8–5.6)

## 2021-12-07 LAB — CYTOLOGY - PAP
Chlamydia: NEGATIVE
Comment: NEGATIVE
Comment: NEGATIVE
Comment: NORMAL
Diagnosis: UNDETERMINED — AB
High risk HPV: POSITIVE — AB
Neisseria Gonorrhea: NEGATIVE

## 2021-12-08 ENCOUNTER — Telehealth: Payer: Self-pay

## 2021-12-08 NOTE — Telephone Encounter (Signed)
Patient contacted office because she has reviewed over her results of her pap dated 11/30/21 on my chart. Patient has questions as to what the results mean and if she needed to retest? Results show patient is high risk HPV postive and ASC-US, patient was last sen by Helmut Muster Copland who is out of office this week.Please review results and advise. KW

## 2021-12-21 ENCOUNTER — Encounter: Payer: Self-pay | Admitting: Registered"

## 2021-12-21 ENCOUNTER — Encounter: Payer: Medicaid Other | Attending: Pediatrics | Admitting: Registered"

## 2021-12-21 DIAGNOSIS — E282 Polycystic ovarian syndrome: Secondary | ICD-10-CM

## 2021-12-21 NOTE — Patient Instructions (Addendum)
Great job on cutting out soda! Continue with your plan to start ovasitol.  Continue getting protein with breakfast, include walnuts in your oatmeal to help get more protein, fiber and omega 3 Continue eating vegetables and beans daily. Continue doing daily squats. Resistance exercise can help with insulin sensitivity.  Continue paying attention to calories in drinks Use your hunger and fullness cues when eating meals.

## 2021-12-21 NOTE — Progress Notes (Signed)
Medical Nutrition Therapy follow-up Appointment Start time:  26  Appointment End time:  1105  Primary concerns today: wants to improve health, planning for a healthy pregnancy Referral diagnosis: PCOS Preferred learning style: no preference indicated Learning readiness: ready  NUTRITION ASSESSMENT  Anthropometrics  Not assessed.   Clinical Medical Hx: PCOS, vitamin D deficiency Medications: metformin 500 mg with breakfast stared 12/01/21, prenatal vitamin Labs: 06/07/2016: Insulin- 25, A1C 6.2% Nov 2023 (started metformin) Vitamin D 23.9. Notable Signs/Symptoms: hair loss, irregular periods, occasional low energy.   Lifestyle & Dietary Hx Pt reports positive changes and is getting better quality sleep, naps are a lot shorter. Reports she has pretty much cut soda, drinking sparkling water instead. Pt states she is going to be ordering Ovasitol today.  Pt states work cafeteria sometimes has foods she will eat but has started bringing breakfast and lunch, and including protein such as egg sandwich (100% whole wheat), protein bar.  Pt reports vegetables daily, made a vegetable soup yesterday and had a cantaloupe smoothie. Soup had beef, corn, squash, tomato onion, cilantro, potato.  Estimated daily fluid intake: > 64 oz water Supplements: Prenatal vitamin Sleep: 8-9 hrs Nap: 2x/wk 15-20 minutes  Stress / self-care: high stress. 5/10 (was 6/10) Current average weekly physical activity: ADL (active at work) stretches, 10 squats in the morning  24-Hr Dietary Recall First Meal: oatmeal, cinnamon, apple, water  Snack: small bag Doritos Second Meal: soup  Snack: none  Third Meal: soup, cantaloupe smoothie Snack: none Beverages: water, sparkling water   NUTRITION DIAGNOSIS  NB-1.1 Food and nutrition-related knowledge deficit As related to protein at breakfast to help insulin.  As evidenced by pt states she does not eat breakfast.  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 Continue with your plan to start ovasitol.  Continue getting protein with breakfast, include walnuts in your oatmeal to help get more protein, fiber and omega 3 Continue eating vegetables and beans daily. Continue doing daily squats. Resistance exercise can help with insulin sensitivity.  Continue paying attention to calories in drinks Use your hunger and fullness cues when eating meals.  MONITORING & EVALUATION Dietary intake, weekly physical activity, and follow up 4-6 weeks.

## 2021-12-22 NOTE — Telephone Encounter (Signed)
Pt has seen Alicia's msg under pap results.

## 2021-12-22 NOTE — Telephone Encounter (Signed)
Called pt and she said her questions were answered in mychart msg [place] sent via [place].

## 2021-12-22 NOTE — Telephone Encounter (Signed)
Pls call pt to see if you can answer her questions. Retest is in 1 yr. Thx

## 2022-03-24 ENCOUNTER — Ambulatory Visit: Payer: Medicaid Other | Admitting: Registered"

## 2022-09-23 NOTE — Progress Notes (Signed)
Michelle Lodge, MD   Chief Complaint  Patient presents with   Vaginal Bleeding    For the last four weeks, pelvic pain in entire area    HPI:      Michelle Hahn is a 22 y.o. G1P0010 whose LMP was Patient's last menstrual period was 08/23/2022 (approximate)., presents today for AUB for the past 4 weeks. Pt with hx of PCOS; was having Q2-3 month menses but hadn't had a period in 5 months when started bleeding. Changing products hourly at times, with long clots. Also with mild dysmen, no meds needed. Tried 3 days of OCPs when bleeding started but thought it made it heavier and more painful, so stopped them. Pt wants to conceive so has declined OCPs in past.  Tried metformin due to pre-DM on labs 11/23 and wanting to conceive. Pt states it didn't regular her cycles so stopped it. Stopped seeing a nutritionist for weight loss but plans to restart. Up to 17# since 11/23 appt.   Really wants to get pregnant now.    Past Surgical History:  Procedure Laterality Date   NO PAST SURGERIES      Family History  Problem Relation Age of Onset   Stomach cancer Paternal Grandfather     Social History   Socioeconomic History   Marital status: Single    Spouse name: Not on file   Number of children: Not on file   Years of education: Not on file   Highest education level: Not on file  Occupational History   Not on file  Tobacco Use   Smoking status: Never   Smokeless tobacco: Never  Vaping Use   Vaping status: Never Used  Substance and Sexual Activity   Alcohol use: Yes   Drug use: Never   Sexual activity: Yes    Birth control/protection: None  Other Topics Concern   Not on file  Social History Narrative   Not on file   Social Determinants of Health   Financial Resource Strain: Not on file  Food Insecurity: No Food Insecurity (09/07/2021)   Hunger Vital Sign    Worried About Running Out of Food in the Last Year: Never true    Ran Out of Food in the Last Year: Never  true  Transportation Needs: Not on file  Physical Activity: Not on file  Stress: Not on file  Social Connections: Not on file  Intimate Partner Violence: Not on file    Outpatient Medications Prior to Visit  Medication Sig Dispense Refill   metFORMIN (GLUCOPHAGE) 500 MG tablet Take 1 tablet (500 mg total) by mouth daily with breakfast. (Patient not taking: Reported on 09/24/2022) 90 tablet 0   norgestimate-ethinyl estradiol (ORTHO-CYCLEN) 0.25-35 MG-MCG tablet Take 1 tablet by mouth daily. (Patient not taking: Reported on 11/30/2021) 84 tablet 4   Prenatal Vit-Fe Fumarate-FA (PRENATAL MULTIVITAMIN) TABS tablet Take 1 tablet by mouth daily at 12 noon.     No facility-administered medications prior to visit.      ROS:  Review of Systems  Constitutional:  Negative for fever.  Gastrointestinal:  Negative for blood in stool, constipation, diarrhea, nausea and vomiting.  Genitourinary:  Positive for menstrual problem and pelvic pain. Negative for dyspareunia, dysuria, flank pain, frequency, hematuria, urgency, vaginal bleeding, vaginal discharge and vaginal pain.  Musculoskeletal:  Negative for back pain.  Skin:  Negative for rash.   BREAST: No symptoms   OBJECTIVE:   Vitals:  BP 128/84   Ht 5\' 3"  (1.6  m)   Wt 277 lb (125.6 kg)   LMP 08/23/2022 (Approximate)   BMI 49.07 kg/m   Physical Exam Vitals reviewed.  Constitutional:      Appearance: She is well-developed.  Pulmonary:     Effort: Pulmonary effort is normal.  Genitourinary:    General: Normal vulva.     Pubic Area: No rash.      Labia:        Right: No rash, tenderness or lesion.        Left: No rash, tenderness or lesion.      Vagina: Bleeding present. No vaginal discharge, erythema or tenderness.     Cervix: Normal.     Uterus: Normal. Not enlarged and not tender.      Adnexa: Right adnexa normal and left adnexa normal.       Right: No mass or tenderness.         Left: No mass or tenderness.     Musculoskeletal:        General: Normal range of motion.     Cervical back: Normal range of motion.  Skin:    General: Skin is warm and dry.  Neurological:     General: No focal deficit present.     Mental Status: She is alert and oriented to person, place, and time.  Psychiatric:        Mood and Affect: Mood normal.        Behavior: Behavior normal.        Thought Content: Thought content normal.        Judgment: Judgment normal.     Assessment/Plan: Abnormal uterine bleeding (AUB) - Plan: norethindrone (AYGESTIN) 5 MG tablet; pt bleeding for 4 wks. Rx aygestin for 10 days to stop bleeding. F/u prn.   PCOS (polycystic ovarian syndrome) - Plan: Ambulatory referral to Endocrinology; discussed importance of menses Q3 months with PCOS; AUB can occur if no periods. Refer to [facility] for infertility mgmt since we don't do here.   Pre-diabetes - Plan: Hemoglobin A1c; check labs today. Still may be good candidate for metformin.   Infertility associated with anovulation - Plan: Ambulatory referral to Endocrinology    Meds ordered this encounter  Medications   norethindrone (AYGESTIN) 5 MG tablet    Sig: Take 1 tablet (5 mg total) by mouth daily for 10 days.    Dispense:  10 tablet    Refill:  0    Order Specific Question:   Supervising Provider    Answer:   Hildred Laser [[id number]]      Return if symptoms worsen or fail to improve.  Michelle Giambra B. Remmy Crass, PA-C 09/24/2022 12:44 PM

## 2022-09-24 ENCOUNTER — Ambulatory Visit (INDEPENDENT_AMBULATORY_CARE_PROVIDER_SITE_OTHER): Payer: Self-pay | Admitting: Obstetrics and Gynecology

## 2022-09-24 ENCOUNTER — Encounter: Payer: Self-pay | Admitting: Obstetrics and Gynecology

## 2022-09-24 VITALS — BP 128/84 | Ht 63.0 in | Wt 277.0 lb

## 2022-09-24 DIAGNOSIS — N939 Abnormal uterine and vaginal bleeding, unspecified: Secondary | ICD-10-CM

## 2022-09-24 DIAGNOSIS — N970 Female infertility associated with anovulation: Secondary | ICD-10-CM

## 2022-09-24 DIAGNOSIS — E282 Polycystic ovarian syndrome: Secondary | ICD-10-CM

## 2022-09-24 DIAGNOSIS — R7303 Prediabetes: Secondary | ICD-10-CM

## 2022-09-24 MED ORDER — NORETHINDRONE ACETATE 5 MG PO TABS: 5.0000 mg | ORAL_TABLET | Freq: Every day | ORAL | 0 refills | Status: DC

## 2022-09-24 NOTE — Patient Instructions (Signed)
I value your feedback and you entrusting us with your care. If you get a [facility] patient survey, I would appreciate you taking the time to let us know about your experience today. Thank you! ? ? ?

## 2022-09-25 LAB — HEMOGLOBIN A1C
Est. average glucose Bld gHb Est-mCnc: 126 mg/dL
Hgb A1c MFr Bld: 6 % — ABNORMAL HIGH (ref 4.8–5.6)

## 2023-01-12 ENCOUNTER — Other Ambulatory Visit: Payer: Self-pay | Admitting: Obstetrics and Gynecology

## 2023-01-12 DIAGNOSIS — N939 Abnormal uterine and vaginal bleeding, unspecified: Secondary | ICD-10-CM

## 2023-01-13 ENCOUNTER — Other Ambulatory Visit: Payer: Self-pay | Admitting: Obstetrics and Gynecology

## 2023-01-13 DIAGNOSIS — N939 Abnormal uterine and vaginal bleeding, unspecified: Secondary | ICD-10-CM

## 2023-01-13 MED ORDER — NORETHINDRONE ACETATE 5 MG PO TABS: 5.0000 mg | ORAL_TABLET | Freq: Every day | ORAL | 0 refills | Status: AC

## 2023-01-13 NOTE — Progress Notes (Signed)
Rx RF aygestin for irregular bleeding.

## 2023-03-31 ENCOUNTER — Other Ambulatory Visit: Payer: Self-pay

## 2023-03-31 DIAGNOSIS — E282 Polycystic ovarian syndrome: Secondary | ICD-10-CM

## 2023-04-11 ENCOUNTER — Other Ambulatory Visit: Payer: Self-pay

## 2023-04-12 LAB — COMPREHENSIVE METABOLIC PANEL
AG Ratio: 1.4 (calc) (ref 1.0–2.5)
ALT: 44 U/L — ABNORMAL HIGH (ref 6–29)
AST: 25 U/L (ref 10–30)
Albumin: 3.8 g/dL (ref 3.6–5.1)
Alkaline phosphatase (APISO): 72 U/L (ref 31–125)
BUN: 9 mg/dL (ref 7–25)
CO2: 25 mmol/L (ref 20–32)
Calcium: 8.7 mg/dL (ref 8.6–10.2)
Chloride: 105 mmol/L (ref 98–110)
Creat: 0.54 mg/dL (ref 0.50–0.96)
Globulin: 2.8 g/dL (ref 1.9–3.7)
Glucose, Bld: 134 mg/dL — ABNORMAL HIGH (ref 65–99)
Potassium: 4 mmol/L (ref 3.5–5.3)
Sodium: 139 mmol/L (ref 135–146)
Total Bilirubin: 0.4 mg/dL (ref 0.2–1.2)
Total Protein: 6.6 g/dL (ref 6.1–8.1)

## 2023-04-13 ENCOUNTER — Ambulatory Visit (INDEPENDENT_AMBULATORY_CARE_PROVIDER_SITE_OTHER): Payer: Self-pay | Admitting: "Endocrinology

## 2023-04-13 ENCOUNTER — Encounter: Payer: Self-pay | Admitting: "Endocrinology

## 2023-04-13 VITALS — BP 106/80 | HR 82 | Ht 63.0 in | Wt 257.0 lb

## 2023-04-13 DIAGNOSIS — E88819 Insulin resistance, unspecified: Secondary | ICD-10-CM

## 2023-04-13 DIAGNOSIS — E282 Polycystic ovarian syndrome: Secondary | ICD-10-CM

## 2023-04-13 DIAGNOSIS — R7303 Prediabetes: Secondary | ICD-10-CM

## 2023-04-13 DIAGNOSIS — E782 Mixed hyperlipidemia: Secondary | ICD-10-CM

## 2023-04-13 MED ORDER — METFORMIN HCL 500 MG PO TABS: 1000.0000 mg | ORAL_TABLET | Freq: Two times a day (BID) | ORAL | 2 refills | Status: AC

## 2023-04-13 NOTE — Progress Notes (Signed)
 Outpatient Endocrinology Note Michelle Hahn [place], MD    BANA BORGMEYER [birth year] [id number]  Referring Provider: Rica Records, PA-C Primary Care Provider: Dorann Lodge, MD Reason for consultation: Subjective   Assessment & Plan  Diagnoses and all orders for this visit:  PCOS (polycystic ovarian syndrome)  Morbid obesity (HCC)  Mixed hypercholesterolemia and hypertriglyceridemia  Insulin resistance  Prediabetes  Other orders -     metFORMIN (GLUCOPHAGE) 500 MG tablet; Take 2 tablets (1,000 mg total) by mouth 2 (two) times daily with a meal.   Patient referred you for polycystic ovarian syndrome diagnosed in 2024 Patient has a history of irregular menstrual cycles as well as reports acne.  Testosterone levels are pending.  Normal ovarian ultrasound available Patient has been trying to get pregnant for the past 2 years and is experiencing primary subfertility. Was on metformin at 1 point but self stopped it.  No known side effects We will start metformin. If experiences upset stomach, she will decrease it back to the previous dose that she tolerated.  Week 1: one tablet after dinner Week 2: one tablet after breakfast and one after dinner  Week 3: one tablet after breakfast and two after dinner  Week 4 and onwards: two tablets after breakfast and two after dinner  Patient has morbid obesity with mixed hypercholesterolemia, prediabetes and insulin resistance Pt interested in weight loss Discussed lifestyle changes, medical management as well as bariatric surgery Maintain healthy lifestyle including 1200 Cal/day, 30 min of activity/day, avoiding refined/processed/outside food 20 minutes physical activity per day, in continuum or interruptedly through the day  Goals: less than 60 grams of carbohydrate/meal, 1200 Cal/day, 10,0000 steps a day and weight loss of 0.5-1 lb/ wk  Sleep 7-9 hours/day, adapt good sleep hygiene Adapt de-stressing and relaxation  techniques to prevent stress induced weight gain Avoid/switch medications that lead to weight gain by discussing with the prescribing physician   Return in about 3 months (around 07/14/2023).   I have reviewed current medications, nurse's notes, allergies, vital signs, past medical and surgical history, family medical history, and social history for this encounter. Counseled patient on symptoms, examination findings, lab findings, imaging results, treatment decisions and monitoring and prognosis. The patient understood the recommendations and agrees with the treatment plan. All questions regarding treatment plan were fully answered.  Michelle Hahn [place], MD  04/13/23   History of Present Illness HPI  Michelle Hahn EYERLY is a 23 y.o. year old female who presents for evaluation of PCOS in 2024.  Reports gaining weight more after 23 years of age, but was was 200 lbs at that time too. Took birth control pill from 2019-2020 and gained weight with it, as well as with nexplanon.  No birth control since around 2023. Mom has excess weight, is on ozempic and lost 40 lb.   Going to gym for past 2 months and lost some weight.   Patient has   o ovulatory dysfunction   -oligomenorrhea or amenorrhea  Yes  -infertility (primary/secondary)  Yes, trying to get pregnant for 2 years   o clinical hyperandrogenism, including  -hirsutism  No  -acne Yes  -andogenetic alopecia No  -virilizing symptoms (rare) including    *female pattern balding No  *increased muscle mass No  *deepening of the voice No  *clitoromegaly No  o history of weight gain Yes  o history of cardiovascular risk factors including  -cardiovascular disease No  -hypertension No  -dyslipidemia No  -vascular disease  No  -impaired  glucose tolerance or type 2 diabetes No  -renal disease No  -obstructive sleep apnea No  o history of depression and anxiety No  o           Medication History   -oral contraceptives use No (for example,  type of progestins, how long took oral contraceptives) since long-term use may mask or delay recognition of menstrual dysfunction  -systemic and topical treatments for acne and hirsutism No  o           Past Medical History (PMH)   previous pelvic surgery No   Physical Exam  BP 106/80   Pulse 82   Ht 5\' 3"  (1.6 m)   Wt 257 lb (116.6 kg)   SpO2 99%   BMI 45.53 kg/m    Constitutional: well developed, well nourished Head: normocephalic, atraumatic Eyes: sclera anicteric, no redness Neck: supple Lungs: normal respiratory effort Neurology: alert and oriented Skin: dry, no appreciable rashes Musculoskeletal: no appreciable defects Psychiatric: normal mood and affect   Current Medications Patient's Medications  New Prescriptions   METFORMIN (GLUCOPHAGE) 500 MG TABLET    Take 2 tablets (1,000 mg total) by mouth 2 (two) times daily with a meal.  Previous Medications   METFORMIN (GLUCOPHAGE) 500 MG TABLET    Take 1 tablet (500 mg total) by mouth daily with breakfast.   NORETHINDRONE (AYGESTIN) 5 MG TABLET    Take 1 tablet (5 mg total) by mouth daily for 14 days.  Modified Medications   No medications on file  Discontinued Medications   No medications on file    Allergies No Known Allergies  Past Medical History Past Medical History:  Diagnosis Date   No pertinent past medical history     Past Surgical History Past Surgical History:  Procedure Laterality Date   NO PAST SURGERIES      Family History family history includes Stomach cancer in her paternal grandfather.  Social History Social History   Socioeconomic History   Marital status: Single    Spouse name: Not on file   Number of children: Not on file   Years of education: Not on file   Highest education level: Not on file  Occupational History   Not on file  Tobacco Use   Smoking status: Never   Smokeless tobacco: Never  Vaping Use   Vaping status: Never Used  Substance and Sexual Activity   Alcohol  use: Yes   Drug use: Never   Sexual activity: Yes    Birth control/protection: None  Other Topics Concern   Not on file  Social History Narrative   Not on file   Social Drivers of Health   Financial Resource Strain: Not on file  Food Insecurity: No Food Insecurity (09/07/2021)   Hunger Vital Sign    Worried About Running Out of Food in the Last Year: Never true    Ran Out of Food in the Last Year: Never true  Transportation Needs: Not on file  Physical Activity: Not on file  Stress: Not on file  Social Connections: Not on file  Intimate Partner Violence: Not on file    Lab Results  Component Value Date   CHOL 200 (H) 04/11/2023   Lab Results  Component Value Date   HDL 36 (L) 04/11/2023   Lab Results  Component Value Date   LDLCALC 132 (H) 04/11/2023   Lab Results  Component Value Date   TRIG 183 (H) 04/11/2023   Lab Results  Component Value Date  CHOLHDL 5.6 (H) 04/11/2023   Lab Results  Component Value Date   CREATININE 0.54 04/11/2023   CREATININE 0.54 04/11/2023   No results found for: "GFR"    Component Value Date/Time   NA 139 04/11/2023 0933   NA 139 04/11/2023 0933   K 4.0 04/11/2023 0933   K 4.0 04/11/2023 0933   CL 105 04/11/2023 0933   CL 105 04/11/2023 0933   CO2 25 04/11/2023 0933   CO2 25 04/11/2023 0933   GLUCOSE 134 (H) 04/11/2023 0933   GLUCOSE 134 (H) 04/11/2023 0933   BUN 9 04/11/2023 0933   BUN 9 04/11/2023 0933   CREATININE 0.54 04/11/2023 0933   CREATININE 0.54 04/11/2023 0933   CALCIUM 8.7 04/11/2023 0933   CALCIUM 8.7 04/11/2023 0933   PROT 6.6 04/11/2023 0933   PROT 6.6 04/11/2023 0933   ALBUMIN 3.9 02/20/2019 1624   AST 25 04/11/2023 0933   AST 25 04/11/2023 0933   ALT 44 (H) 04/11/2023 0933   ALT 44 (H) 04/11/2023 0933   ALKPHOS 69 02/20/2019 1624   BILITOT 0.4 04/11/2023 0933   BILITOT 0.4 04/11/2023 0933   GFRNONAA >60 02/20/2019 1624   GFRAA >60 02/20/2019 1624      Latest Ref Rng & Units 04/11/2023    9:33  AM 02/20/2019    4:24 PM 06/07/2016   11:43 AM  BMP  Glucose 65 - 99 mg/dL 65 - 99 mg/dL 811    914  782  97   BUN 7 - 25 mg/dL 7 - 25 mg/dL 9    9  10  8    Creatinine 0.50 - 0.96 mg/dL 9.56 - 2.13 mg/dL 0.86    5.78  4.69  6.29   BUN/Creat Ratio 6 - 22 (calc) 6 - 22 (calc) SEE NOTE:    SEE NOTE:     Sodium 135 - 146 mmol/L 135 - 146 mmol/L 139    139  140  140   Potassium 3.5 - 5.3 mmol/L 3.5 - 5.3 mmol/L 4.0    4.0  3.9  4.0   Chloride 98 - 110 mmol/L 98 - 110 mmol/L 105    105  107  110   CO2 20 - 32 mmol/L 20 - 32 mmol/L 25    25  26  26    Calcium 8.6 - 10.2 mg/dL 8.6 - 52.8 mg/dL 8.7    8.7  9.1  9.3        Component Value Date/Time   WBC 11.0 (H) 02/20/2019 1624   RBC 5.03 02/20/2019 1624   HGB 13.3 02/20/2019 1624   HCT 41.6 02/20/2019 1624   PLT 236 02/20/2019 1624   MCV 82.7 02/20/2019 1624   MCH 26.4 02/20/2019 1624   MCHC 32.0 02/20/2019 1624   RDW 14.2 02/20/2019 1624   LYMPHSABS 2.4 06/07/2016 1143   MONOABS 0.6 06/07/2016 1143   EOSABS 0.0 06/07/2016 1143   BASOSABS 0.0 06/07/2016 1143   Lab Results  Component Value Date   TSH 1.55 04/11/2023   TSH 1.800 06/13/2020   TSH 0.765 06/07/2016   FREET4 1.1 04/11/2023   FREET4 0.87 06/06/2015         Parts of this note may have been dictated using voice recognition software. There may be variances in spelling and vocabulary which are unintentional. Not all errors are proofread. Please notify the There[facility] Parkin if any discrepancies are noted or if the meaning of any statement is not clear.

## 2023-04-13 NOTE — Patient Instructions (Signed)
 We will start you on metformin. If you do have upset stomach, decrease it back to the previous dose that you tolerated.  Week 1: one tablet after dinner Week 2: one tablet after breakfast and one after dinner  Week 3: one tablet after breakfast and two after dinner  Week 4 and onwards: two tablets after breakfast and two after dinner

## 2023-04-16 LAB — COMPLETE METABOLIC PANEL WITH GFR
AG Ratio: 1.4 (calc) (ref 1.0–2.5)
ALT: 44 U/L — ABNORMAL HIGH (ref 6–29)
AST: 25 U/L (ref 10–30)
Albumin: 3.8 g/dL (ref 3.6–5.1)
Alkaline phosphatase (APISO): 72 U/L (ref 31–125)
BUN: 9 mg/dL (ref 7–25)
CO2: 25 mmol/L (ref 20–32)
Calcium: 8.7 mg/dL (ref 8.6–10.2)
Chloride: 105 mmol/L (ref 98–110)
Creat: 0.54 mg/dL (ref 0.50–0.96)
Globulin: 2.8 g/dL (ref 1.9–3.7)
Glucose, Bld: 134 mg/dL — ABNORMAL HIGH (ref 65–99)
Potassium: 4 mmol/L (ref 3.5–5.3)
Sodium: 139 mmol/L (ref 135–146)
Total Bilirubin: 0.4 mg/dL (ref 0.2–1.2)
Total Protein: 6.6 g/dL (ref 6.1–8.1)

## 2023-04-16 LAB — LIPID PANEL
Cholesterol: 200 mg/dL — ABNORMAL HIGH (ref <200)
HDL: 36 mg/dL — ABNORMAL LOW (ref >=50)
LDL Cholesterol (Calc): 132 mg/dL — ABNORMAL HIGH
Non-HDL Cholesterol (Calc): 164 mg/dL — ABNORMAL HIGH (ref <130)
Total CHOL/HDL Ratio: 5.6 (calc) — ABNORMAL HIGH (ref <5.0)
Triglycerides: 183 mg/dL — ABNORMAL HIGH (ref <150)

## 2023-04-16 LAB — HEMOGLOBIN A1C
Hgb A1c MFr Bld: 5.9 %{Hb} — ABNORMAL HIGH (ref <5.7)
Mean Plasma Glucose: 123 mg/dL
eAG (mmol/L): 6.8 mmol/L

## 2023-04-16 LAB — DHEA-SULFATE: DHEA-SO4: 148 ug/dL (ref 14–349)

## 2023-04-16 LAB — CORTISOL: Cortisol, Plasma: 12.2 ug/dL

## 2023-04-16 LAB — TSH: TSH: 1.55 m[IU]/L

## 2023-04-16 LAB — TESTOSTERONE, FREE & TOTAL
Free Testosterone: 6.4 pg/mL (ref 0.1–6.4)
Testosterone, Total, LC-MS-MS: 28 ng/dL (ref 2–45)

## 2023-04-16 LAB — T4, FREE: Free T4: 1.1 ng/dL (ref 0.8–1.8)

## 2023-04-16 LAB — INSULIN, RANDOM: Insulin: 146.6 u[IU]/mL — ABNORMAL HIGH

## 2023-07-07 ENCOUNTER — Emergency Department: Payer: Self-pay

## 2023-07-07 ENCOUNTER — Other Ambulatory Visit: Payer: Self-pay

## 2023-07-07 DIAGNOSIS — W01198A Fall on same level from slipping, tripping and stumbling with subsequent striking against other object, initial encounter: Secondary | ICD-10-CM | POA: Insufficient documentation

## 2023-07-07 DIAGNOSIS — S6992XA Unspecified injury of left wrist, hand and finger(s), initial encounter: Secondary | ICD-10-CM | POA: Insufficient documentation

## 2023-07-07 NOTE — ED Triage Notes (Signed)
 Pt reports she fell in the shower tonight and injured left hand.

## 2023-07-08 ENCOUNTER — Emergency Department
Admission: EM | Admit: 2023-07-08 | Discharge: 2023-07-08 | Disposition: A | Discharge status: Home / Self Care | Payer: Self-pay | Attending: Emergency Medicine | Admitting: Emergency Medicine

## 2023-07-08 DIAGNOSIS — W19XXXA Unspecified fall, initial encounter: Secondary | ICD-10-CM

## 2023-07-08 DIAGNOSIS — S6992XA Unspecified injury of left wrist, hand and finger(s), initial encounter: Secondary | ICD-10-CM

## 2023-07-08 NOTE — Discharge Instructions (Addendum)
 Please take Tylenol  and ibuprofen/Advil for your pain.  It is safe to take them together, or to alternate them every few hours.  Take up to 1000mg  of Tylenol  at a time, up to 4 times per day.  Do not take more than 4000 mg of Tylenol  in 24 hours.  For ibuprofen, take 400-600 mg, 3 - 4 times per day.  Ice, elevation help with swelling.  Keep the Velcro brace on even while sleeping over the next couple weeks.  You can take it off briefly to bathe and change clothes but put right back on.  If you are still having significant pain and do not seem to be improving after 1-2 weeks then please reach out to the orthopedic doctor listed below for follow-up.  If you have uncontrolled pain, inability to use or feel the left hand or fingers then please return to the ED.

## 2023-07-08 NOTE — ED Provider Notes (Signed)
 [facility] Provider Note    Event Date/Time   First MD Initiated Contact with Patient 07/08/23 0023     (approximate)   History   Fall   HPI  Michelle Hahn is a 23 y.o. female who presents to the ED for evaluation of Fall   Patient presents with her mother after she slipped in the shower, falling and striking her left hand while bracing herself.  No head trauma, syncope or other injuries.  She is left-hand-dominant.  Radial left hand pain   Physical Exam   Triage Vital Signs: ED Triage Vitals  Encounter Vitals Group     BP 07/07/23 2342 120/78     Girls Systolic BP Percentile --      Girls Diastolic BP Percentile --      Boys Systolic BP Percentile --      Boys Diastolic BP Percentile --      Pulse Rate 07/07/23 2342 80     Resp 07/07/23 2342 18     Temp 07/07/23 2342 99 F (37.2 C)     Temp src --      SpO2 07/07/23 2342 99 %     Weight 07/07/23 2341 260 lb (117.9 kg)     Height 07/07/23 2341 5' 2 (1.575 m)     Head Circumference --      Peak Flow --      Pain Score 07/07/23 2341 10     Pain Loc --      Pain Education --      Exclude from Growth Chart --     Most recent vital signs: Vitals:   07/07/23 2342  BP: 120/78  Pulse: 80  Resp: 18  Temp: 99 F (37.2 C)  SpO2: 99%    General: Awake, no distress.  CV:  Good peripheral perfusion.  Resp:  Normal effort.  Abd:  No distention.  MSK:  No deformity noted.  Neuro:  No focal deficits appreciated. Other:  No evidence of open injury.  No signs of neurologic or vascular deficits.  Tenderness to the base of the thenar eminence to the distal aspect of the right wrist and proximal metacarpals.  All on the radial aspect of her left hand.   ED Results / Procedures / Treatments   Labs (all labs ordered are listed, but only abnormal results are displayed) Labs Reviewed - No data to display  EKG   RADIOLOGY Plain film of the left hand interpreted by me without evidence  of fracture or dislocation  Official radiology report(s): DG Hand Complete Left Result Date: 07/08/2023 CLINICAL DATA:  Michelle Hahn in the shower tonight and injured left hand. EXAM: LEFT HAND - COMPLETE 3+ VIEW COMPARISON:  None Available. FINDINGS: There is no evidence of fracture or dislocation. There is no evidence of arthropathy or other focal bone abnormality. Soft tissues are unremarkable. IMPRESSION: Negative. Electronically Signed   By: Rozell Cornet M.D.   On: 07/08/2023 00:06    PROCEDURES and INTERVENTIONS:  Procedures  Medications - No data to display   IMPRESSION / MDM / ASSESSMENT AND PLAN / ED COURSE  I reviewed the triage vital signs and the nursing notes.  Differential diagnosis includes, but is not limited to, metacarpal fracture, dislocation, wrist fracture, soft tissue injury  Patient resents for evaluation of left hand pain after a fall.  X-rays are negative.  Likely soft tissue injury.  Will provide brace, discussed RICE therapies and expectant management.  Orthopedic follow-up information  provided.  Discussed ED return precautions.      FINAL CLINICAL IMPRESSION(S) / ED DIAGNOSES   Final diagnoses:  Fall, initial encounter  Hand injury, left, initial encounter     Rx / DC Orders   ED Discharge Orders     None        Note:  This document was prepared using Dragon voice recognition software and may include unintentional dictation errors.   Arline Bennett, MD 07/08/23 [phone]

## 2023-07-14 ENCOUNTER — Ambulatory Visit: Payer: Self-pay | Admitting: "Endocrinology

## 2024-01-24 ENCOUNTER — Other Ambulatory Visit: Payer: Self-pay

## 2024-01-24 NOTE — Telephone Encounter (Signed)
 Pt called for bc refill pt aware she needs a appointment

## 2024-02-07 NOTE — Progress Notes (Unsigned)
 "  PCP:  Pcp, No   No chief complaint on file.    HPI:      [name] is a 24 y.o. G1P0010 whose LMP was No LMP recorded. (Menstrual status: Irregular Periods)., presents today for her annual examination.  Her menses are {norm/abn:715}, lasting {number: 22536} days.  Dysmenorrhea {dysmen:716}. She {does:18564} have intermenstrual bleeding. Hx of Aub, did aygestin . Hx of PCOS, had wanted to conceive, referred to [facility] in past  Sex activity: single partner, contraception - {contraception:315051}. No pain/bleeding/dryness. Last Pap: 11/30/21 Results were: ASCUS with POSITIVE high risk HPV ; repeat pap due to age Hx of STDs: {STD hx:14358}  There is no FH of breast cancer. There is no FH of ovarian cancer. The patient {does:18564} do self-breast exams.  Tobacco use: {tob:20664} Alcohol use: {Alcohol:11675} No drug use.  Exercise: {exercise:31265}  She {does:18564} get adequate calcium and Vitamin D  in her diet.  Patient Active Problem List   Diagnosis Date Noted   Pre-diabetes 09/24/2022   PCOS (polycystic ovarian syndrome) 11/09/2021   Irregular menses 09/25/2020   Chlamydia 06/17/2020    Past Surgical History:  Procedure Laterality Date   NO PAST SURGERIES      Family History  Problem Relation Age of Onset   Stomach cancer Paternal Grandfather     Social History   Socioeconomic History   Marital status: Single    Spouse name: Not on file   Number of children: Not on file   Years of education: Not on file   Highest education level: Not on file  Occupational History   Not on file  Tobacco Use   Smoking status: Never   Smokeless tobacco: Never  Vaping Use   Vaping status: Never Used  Substance and Sexual Activity   Alcohol use: Yes   Drug use: Never   Sexual activity: Yes    Birth control/protection: None  Other Topics Concern   Not on file  Social History Narrative   Not on file   Social Drivers of Health   Tobacco Use: Low Risk (07/07/2023)    Patient History    Smoking Tobacco Use: Never    Smokeless Tobacco Use: Never    Passive Exposure: Not on file  Financial Resource Strain: Not on file  Food Insecurity: No Food Insecurity (09/07/2021)   Hunger Vital Sign    Worried About Running Out of Food in the Last Year: Never true    Ran Out of Food in the Last Year: Never true  Transportation Needs: Not on file  Physical Activity: Not on file  Stress: Not on file  Social Connections: Not on file  Intimate Partner Violence: Not on file  Depression (PHQ2-9): Low Risk (09/07/2021)   Depression (PHQ2-9)    PHQ-2 Score: 0  Alcohol Screen: Not on file  Housing: Not on file  Utilities: Not on file  Health Literacy: Not on file    Current Medications[1]     ROS:  Review of Systems BREAST: No symptoms   Objective: There were no vitals taken for this visit.   OBGyn Exam  Results: No results found for this or any previous visit (from the past 24 hours).  Assessment/Plan: No diagnosis found.  No orders of the defined types were placed in this encounter.            GYN counsel {counseling: 16159}     F/U  No follow-ups on file.  Maicey Barrientez B. Hanh Kertesz, PA-C 02/07/2024 11:23 AM    [  1]  Current Outpatient Medications:    metFORMIN  (GLUCOPHAGE ) 500 MG tablet, Take 1 tablet (500 mg total) by mouth daily with breakfast. (Patient not taking: Reported on 09/24/2022), Disp: 90 tablet, Rfl: 0   metFORMIN  (GLUCOPHAGE ) 500 MG tablet, Take 2 tablets (1,000 mg total) by mouth 2 (two) times daily with a meal., Disp: 120 tablet, Rfl: 2   norethindrone  (AYGESTIN ) 5 MG tablet, Take 1 tablet (5 mg total) by mouth daily for 14 days., Disp: 14 tablet, Rfl: 0  "

## 2024-02-09 ENCOUNTER — Ambulatory Visit: Payer: Self-pay | Admitting: Obstetrics and Gynecology

## 2024-02-09 DIAGNOSIS — R87610 Atypical squamous cells of undetermined significance on cytologic smear of cervix (ASC-US): Secondary | ICD-10-CM

## 2024-02-09 DIAGNOSIS — Z113 Encounter for screening for infections with a predominantly sexual mode of transmission: Secondary | ICD-10-CM

## 2024-02-09 DIAGNOSIS — Z124 Encounter for screening for malignant neoplasm of cervix: Secondary | ICD-10-CM

## 2024-02-09 DIAGNOSIS — Z01419 Encounter for gynecological examination (general) (routine) without abnormal findings: Secondary | ICD-10-CM

## 2024-02-28 ENCOUNTER — Encounter: Payer: Self-pay | Admitting: Family Medicine

## 2024-02-28 ENCOUNTER — Ambulatory Visit: Payer: Self-pay | Admitting: Family Medicine

## 2024-02-28 VITALS — BP 110/72 | HR 67 | Wt 252.0 lb

## 2024-02-28 DIAGNOSIS — Z01419 Encounter for gynecological examination (general) (routine) without abnormal findings: Secondary | ICD-10-CM

## 2024-02-28 DIAGNOSIS — Z3009 Encounter for other general counseling and advice on contraception: Secondary | ICD-10-CM

## 2024-02-28 DIAGNOSIS — Z113 Encounter for screening for infections with a predominantly sexual mode of transmission: Secondary | ICD-10-CM

## 2024-02-28 LAB — WET PREP FOR TRICH, YEAST, CLUE
Clue Cell Exam: NEGATIVE
Trichomonas Exam: NEGATIVE
Yeast Exam: NEGATIVE

## 2024-02-28 MED ORDER — NORGESTIMATE-ETH ESTRADIOL 0.25-35 MG-MCG PO TABS: 1.0000 | ORAL_TABLET | Freq: Every day | ORAL | 12 refills | Status: AC

## 2024-02-28 NOTE — Progress Notes (Signed)
 Pt is here for PE/BC pills. Wet prep reviewed with patient and requires no treatment per SO.The patient was dispensed #6 packs of SPRINTEC tablet. I provided counseling regarding the BC pills, the side effects and when to call clinic. Opportunity given to pt to ask questions for any clarifications. Questions Answered. Condoms given.  Wilkie Drought, RN.
